# Patient Record
Sex: Male | Born: 1940 | Race: White | Hispanic: No | Marital: Single | Smoking: Former smoker
Health system: Southern US, Community
[De-identification: ages and names within clinical notes are randomized; demographics above are authoritative.]

## PROBLEM LIST (undated history)

## (undated) DIAGNOSIS — F32A Depression, unspecified: Secondary | ICD-10-CM

## (undated) DIAGNOSIS — J4489 Other specified chronic obstructive pulmonary disease: Secondary | ICD-10-CM

## (undated) DIAGNOSIS — F419 Anxiety disorder, unspecified: Secondary | ICD-10-CM

## (undated) DIAGNOSIS — F329 Major depressive disorder, single episode, unspecified: Secondary | ICD-10-CM

## (undated) DIAGNOSIS — R32 Unspecified urinary incontinence: Secondary | ICD-10-CM

## (undated) DIAGNOSIS — J449 Chronic obstructive pulmonary disease, unspecified: Secondary | ICD-10-CM

## (undated) DIAGNOSIS — R339 Retention of urine, unspecified: Secondary | ICD-10-CM

## (undated) DIAGNOSIS — K5792 Diverticulitis of intestine, part unspecified, without perforation or abscess without bleeding: Secondary | ICD-10-CM

## (undated) HISTORY — DX: Major depressive disorder, single episode, unspecified: F32.9

## (undated) HISTORY — DX: Anxiety disorder, unspecified: F41.9

## (undated) HISTORY — DX: Unspecified urinary incontinence: R32

## (undated) HISTORY — PX: OTHER SURGICAL HISTORY: SHX169

## (undated) HISTORY — DX: Diverticulitis of intestine, part unspecified, without perforation or abscess without bleeding: K57.92

## (undated) HISTORY — DX: Depression, unspecified: F32.A

## (undated) HISTORY — DX: Retention of urine, unspecified: R33.9

## (undated) HISTORY — DX: Chronic obstructive pulmonary disease, unspecified: J44.9

## (undated) HISTORY — DX: Other specified chronic obstructive pulmonary disease: J44.89

---

## 2014-10-10 DIAGNOSIS — N419 Inflammatory disease of prostate, unspecified: Secondary | ICD-10-CM | POA: Diagnosis not present

## 2015-04-07 ENCOUNTER — Ambulatory Visit (INDEPENDENT_AMBULATORY_CARE_PROVIDER_SITE_OTHER): Payer: Medicare Other | Admitting: Family Medicine

## 2015-04-07 ENCOUNTER — Encounter: Payer: Self-pay | Admitting: Family Medicine

## 2015-04-07 VITALS — BP 120/77 | HR 65 | Temp 97.8°F | Resp 16 | Ht 73.0 in | Wt 130.2 lb

## 2015-04-07 DIAGNOSIS — R202 Paresthesia of skin: Secondary | ICD-10-CM | POA: Diagnosis not present

## 2015-04-07 DIAGNOSIS — J441 Chronic obstructive pulmonary disease with (acute) exacerbation: Secondary | ICD-10-CM | POA: Diagnosis not present

## 2015-04-07 DIAGNOSIS — N400 Enlarged prostate without lower urinary tract symptoms: Secondary | ICD-10-CM

## 2015-04-07 DIAGNOSIS — G6289 Other specified polyneuropathies: Secondary | ICD-10-CM

## 2015-04-07 DIAGNOSIS — J42 Unspecified chronic bronchitis: Secondary | ICD-10-CM | POA: Insufficient documentation

## 2015-04-07 DIAGNOSIS — E785 Hyperlipidemia, unspecified: Secondary | ICD-10-CM | POA: Diagnosis not present

## 2015-04-07 DIAGNOSIS — G629 Polyneuropathy, unspecified: Secondary | ICD-10-CM | POA: Insufficient documentation

## 2015-04-07 MED ORDER — IPRATROPIUM-ALBUTEROL 18-103 MCG/ACT IN AERO: 2.0000 | INHALATION_SPRAY | Freq: Four times a day (QID) | RESPIRATORY_TRACT | Status: DC | PRN

## 2015-04-07 MED ORDER — PREDNISONE 20 MG PO TABS: 40.0000 mg | ORAL_TABLET | Freq: Every day | ORAL | Status: DC

## 2015-04-07 MED ORDER — AZITHROMYCIN 250 MG PO TABS: ORAL_TABLET | ORAL | Status: DC

## 2015-04-07 MED ORDER — FLUTICASONE PROPIONATE HFA 220 MCG/ACT IN AERO: 2.0000 | INHALATION_SPRAY | Freq: Two times a day (BID) | RESPIRATORY_TRACT | Status: DC

## 2015-04-07 MED ORDER — TAMSULOSIN HCL 0.4 MG PO CAPS: 0.4000 mg | ORAL_CAPSULE | Freq: Every day | ORAL | Status: DC

## 2015-04-07 NOTE — Assessment & Plan Note (Signed)
Treat for COPD exacerbation. Give Antibiotics considering increasing dyspnea, purulent sputum. Prednisone burst. Start steroid inhaler. PRN albuterol inhaler use.  Return 3 mos or if not improved.

## 2015-04-07 NOTE — Progress Notes (Signed)
Subjective:    Patient ID: Connor Lewis, male    DOB: 1941-02-19, 75 y.o.   MRN: [id number]  HPI: Connor Lewis is a 75 y.o. male presenting on 04/07/2015 for Establish Care   HPI  Pt presents to establish care today. Previous care provider was Dr. Vear Clock in [place].  It has been 6 months since His last PCP visit. Records from previous provider will be requested and reviewed. Current medical problems include:  Chronic Bronchitis/ COPD: Diagnosed 6 years ago. Combivent inhaler daily. Rescue inhaler 1-2 times per day. Productive cough. Baseline is white/clear. Cough with some SOB 2-3 weeks ago. Sputum was green. Mild dyspnea at baseline. Increasing dyspnea. Increasing sputum.  BPH- Nocturia and LUTS.  Started on flomax to help with symptoms. Occasional dribbling and hesitancy.  Peripheral neuropathy: Pt is unsure of cause. Taking gabapentin to help with symptoms. Relieves symptoms.   Health maintenance:  Colonoscopy: Unsure. Desires cologuard today.  PSA needed.  Drinks 3-4 drinks nightly.    Past Medical History  Diagnosis Date  . Chronic bronchiolitis (HCC)   . Diverticulitis   . Urine incontinence   . COPD (chronic obstructive pulmonary disease) (HCC)    Social History   Social History  . Marital Status: Single    Spouse Name: N/A  . Number of Children: N/A  . Years of Education: N/A   Occupational History  . Not on file.   Social History Main Topics  . Smoking status: Never Smoker   . Smokeless tobacco: Not on file  . Alcohol Use: Yes  . Drug Use: No  . Sexual Activity: Not on file   Other Topics Concern  . Not on file   Social History Narrative  . No narrative on file   Family History  Problem Relation Age of Onset  . Stroke Mother   . Diabetes Mother   . Heart disease Father    No current outpatient prescriptions on file prior to visit.   No current facility-administered medications on file prior to visit.    Review of Systems  Constitutional:  Negative for fever and chills.  HENT: Negative.   Respiratory: Positive for cough, chest tightness, shortness of breath and wheezing.   Cardiovascular: Negative for chest pain, palpitations and leg swelling.  Gastrointestinal: Negative for nausea, vomiting and abdominal pain.  Endocrine: Negative.   Genitourinary: Positive for frequency and difficulty urinating. Negative for dysuria, urgency, discharge, penile pain and testicular pain.  Musculoskeletal: Negative for back pain, joint swelling and arthralgias.  Skin: Negative.   Neurological: Negative for dizziness, weakness, numbness and headaches.  Psychiatric/Behavioral: Negative for sleep disturbance and dysphoric mood.   Per HPI unless specifically indicated above     Objective:    BP 120/77 mmHg  Pulse 65  Temp(Src) 97.8 F (36.6 C) (Oral)  Resp 16  Ht  (1.854 m)  Wt 130 lb 3.2 oz (59.058 kg)  BMI 17.18 kg/m2  SpO2 97%  Wt Readings from Last 3 Encounters:  04/07/15 130 lb 3.2 oz (59.058 kg)    Physical Exam  Constitutional: He is oriented to person, place, and time. He appears well-developed and well-nourished. No distress.  HENT:  Head: Normocephalic and atraumatic.  Neck: Neck supple. No thyromegaly present.  Cardiovascular: Normal rate, regular rhythm and normal heart sounds.  Exam reveals no gallop and no friction rub.   No murmur heard. Pulmonary/Chest: Effort normal. He has wheezes (with couhging. ) in the right middle field, the right lower field, the left  middle field and the left lower field. He has rhonchi in the right lower field and the left middle field. Chest wall is not dull to percussion. He exhibits no mass and no tenderness.  Abdominal: Soft. Bowel sounds are normal. He exhibits no distension. There is no tenderness. There is no rebound.  Musculoskeletal: Normal range of motion. He exhibits no edema or tenderness.  Neurological: He is alert and oriented to person, place, and time. He has normal  reflexes.  Skin: Skin is warm and dry. No rash noted. No erythema.  Psychiatric: He has a normal mood and affect. His behavior is normal. Thought content normal.   No results found for this or any previous visit.    Assessment & Plan:   Problem List Items Addressed This Visit      Respiratory   Chronic bronchitis (HCC) - Primary    Treat for COPD exacerbation. Give Antibiotics considering increasing dyspnea, purulent sputum. Prednisone burst. Start steroid inhaler. PRN albuterol inhaler use.  Return 3 mos or if not improved.       Relevant Medications   azithromycin (ZITHROMAX) 250 MG tablet   predniSONE (DELTASONE) 20 MG tablet   albuterol-ipratropium (COMBIVENT) 18-103 MCG/ACT inhaler   fluticasone (FLOVENT HFA) 220 MCG/ACT inhaler     Nervous and Auditory   Peripheral neuropathy (HCC)    Continue gabapentin. Get labwork to determine cause since patient unsure. Check B12, consider diabetes screening.       Relevant Medications   gabapentin (NEURONTIN) 100 MG capsule   Other Relevant Orders   CBC with Differential   Vitamin B12     Genitourinary   BPH (benign prostatic hyperplasia)    Check PSA. Continue flomax.       Relevant Medications   tamsulosin (FLOMAX) 0.4 MG CAPS capsule   Other Relevant Orders   PSA    Other Visit Diagnoses    Mild hyperlipidemia        Check lipid panel.     Relevant Orders    Lipid Profile    Paresthesia        Relevant Orders    Vitamin B12       Meds ordered this encounter  Medications  . DISCONTD: albuterol-ipratropium (COMBIVENT) 18-103 MCG/ACT inhaler    Sig: Inhale 1 puff into the lungs every 6 (six) hours as needed for wheezing or shortness of breath.  Marland Kitchen albuterol (VENTOLIN HFA) 108 (90 Base) MCG/ACT inhaler    Sig: Inhale 2 puffs into the lungs as needed for wheezing or shortness of breath.  . gabapentin (NEURONTIN) 100 MG capsule    Sig: Take 100 mg by mouth 3 (three) times daily.  Marland Kitchen DISCONTD: tamsulosin (FLOMAX) 0.4  MG CAPS capsule    Sig: Take 0.4 mg by mouth at bedtime.  Marland Kitchen DISCONTD: predniSONE (DELTASONE) 10 MG tablet    Sig: Take 10 mg by mouth 2 (two) times daily.  Marland Kitchen azithromycin (ZITHROMAX) 250 MG tablet    Sig: Take 2 pills today and 1 pill everyday until bottle empty.    Dispense:  6 tablet    Refill:  0    Order Specific Question:  Supervising Provider    Answer:  Janeann Forehand [[id number]]  . predniSONE (DELTASONE) 20 MG tablet    Sig: Take 2 tablets (40 mg total) by mouth daily with breakfast.    Dispense:  10 tablet    Refill:  0    Order Specific Question:  Supervising Provider  Answer:  Janeann Forehand [[id number]]  . albuterol-ipratropium (COMBIVENT) 18-103 MCG/ACT inhaler    Sig: Inhale 2 puffs into the lungs every 6 (six) hours as needed for wheezing or shortness of breath.    Dispense:  1 Inhaler    Refill:  11    Order Specific Question:  Supervising Provider    Answer:  Janeann Forehand [phone]  . fluticasone (FLOVENT HFA) 220 MCG/ACT inhaler    Sig: Inhale 2 puffs into the lungs 2 (two) times daily.    Dispense:  1 Inhaler    Refill:  12    Order Specific Question:  Supervising Provider    Answer:  Janeann Forehand [phone]  . tamsulosin (FLOMAX) 0.4 MG CAPS capsule    Sig: Take 1 capsule (0.4 mg total) by mouth at bedtime.    Dispense:  30 capsule    Refill:  11    Order Specific Question:  Supervising Provider    Answer:  Janeann Forehand [phone]      Follow up plan: Return in about 3 months (around 07/05/2015), or if symptoms worsen or fail to improve, for COPD.

## 2015-04-07 NOTE — Assessment & Plan Note (Signed)
Continue gabapentin. Get labwork to determine cause since patient unsure. Check B12, consider diabetes screening.

## 2015-04-07 NOTE — Patient Instructions (Signed)
We are treating your for a COPD exacerbation today. Take Prednisone  (2 tablets) every morning for 5 days. Start Zpak antibiotic- 2 pills today and 1 pill daily until bottle empty. Use your combivent inhaler every 4-6 hours as needed.  Start new Floven inhaler 2 puffs twice daily.   Please seek immediate medical attention if you develop shortness of breath not relieve by inhaler, chest pain/tightness, fever > 103 F or other concerning symptoms.

## 2015-04-07 NOTE — Assessment & Plan Note (Signed)
Check PSA. Continue flomax.

## 2015-04-11 DIAGNOSIS — R202 Paresthesia of skin: Secondary | ICD-10-CM | POA: Diagnosis not present

## 2015-04-11 DIAGNOSIS — J441 Chronic obstructive pulmonary disease with (acute) exacerbation: Secondary | ICD-10-CM | POA: Diagnosis not present

## 2015-04-11 DIAGNOSIS — E785 Hyperlipidemia, unspecified: Secondary | ICD-10-CM | POA: Diagnosis not present

## 2015-04-11 DIAGNOSIS — G6289 Other specified polyneuropathies: Secondary | ICD-10-CM | POA: Diagnosis not present

## 2015-04-11 DIAGNOSIS — R7303 Prediabetes: Secondary | ICD-10-CM | POA: Diagnosis not present

## 2015-04-11 DIAGNOSIS — N400 Enlarged prostate without lower urinary tract symptoms: Secondary | ICD-10-CM | POA: Diagnosis not present

## 2015-04-11 DIAGNOSIS — R740 Nonspecific elevation of levels of transaminase and lactic acid dehydrogenase [LDH]: Secondary | ICD-10-CM | POA: Diagnosis not present

## 2015-04-12 ENCOUNTER — Other Ambulatory Visit: Payer: Self-pay | Admitting: Family Medicine

## 2015-04-12 DIAGNOSIS — J410 Simple chronic bronchitis: Secondary | ICD-10-CM

## 2015-04-12 LAB — COMPREHENSIVE METABOLIC PANEL
A/G RATIO: 2 (ref 1.1–2.5)
ALK PHOS: 66 IU/L (ref 39–117)
ALT: 13 IU/L (ref 0–44)
AST: 18 IU/L (ref 0–40)
Albumin: 4.1 g/dL (ref 3.5–4.8)
BUN/Creatinine Ratio: 16 (ref 10–22)
BUN: 14 mg/dL (ref 8–27)
Bilirubin Total: 0.5 mg/dL (ref 0.0–1.2)
CALCIUM: 9.7 mg/dL (ref 8.6–10.2)
CHLORIDE: 96 mmol/L (ref 96–106)
CO2: 25 mmol/L (ref 18–29)
Creatinine, Ser: 0.86 mg/dL (ref 0.76–1.27)
GFR calc Af Amer: 100 mL/min/{1.73_m2} (ref >59)
GFR, EST NON AFRICAN AMERICAN: 87 mL/min/{1.73_m2} (ref >59)
GLOBULIN, TOTAL: 2.1 g/dL (ref 1.5–4.5)
Glucose: 152 mg/dL — ABNORMAL HIGH (ref 65–99)
POTASSIUM: 3.9 mmol/L (ref 3.5–5.2)
SODIUM: 139 mmol/L (ref 134–144)
Total Protein: 6.2 g/dL (ref 6.0–8.5)

## 2015-04-12 LAB — CBC WITH DIFFERENTIAL/PLATELET
BASOS ABS: 0 10*3/uL (ref 0.0–0.2)
BASOS: 0 %
EOS (ABSOLUTE): 0.1 10*3/uL (ref 0.0–0.4)
EOS: 1 %
HEMATOCRIT: 42.6 % (ref 37.5–51.0)
HEMOGLOBIN: 14.6 g/dL (ref 12.6–17.7)
IMMATURE GRANS (ABS): 0 10*3/uL (ref 0.0–0.1)
Immature Granulocytes: 0 %
LYMPHS ABS: 0.8 10*3/uL (ref 0.7–3.1)
LYMPHS: 5 %
MCH: 29.8 pg (ref 26.6–33.0)
MCHC: 34.3 g/dL (ref 31.5–35.7)
MCV: 87 fL (ref 79–97)
MONOCYTES: 1 %
Monocytes Absolute: 0.2 10*3/uL (ref 0.1–0.9)
NEUTROS ABS: 15.1 10*3/uL — AB (ref 1.4–7.0)
Neutrophils: 93 %
Platelets: 261 10*3/uL (ref 150–379)
RBC: 4.9 x10E6/uL (ref 4.14–5.80)
RDW: 13.7 % (ref 12.3–15.4)
WBC: 16.2 10*3/uL — ABNORMAL HIGH (ref 3.4–10.8)

## 2015-04-12 LAB — PSA: PROSTATE SPECIFIC AG, SERUM: 3.5 ng/mL (ref 0.0–4.0)

## 2015-04-12 LAB — LIPID PANEL
CHOL/HDL RATIO: 3.5 ratio (ref 0.0–5.0)
CHOLESTEROL TOTAL: 242 mg/dL — AB (ref 100–199)
HDL: 70 mg/dL (ref >39)
LDL Calculated: 138 mg/dL — ABNORMAL HIGH (ref 0–99)
TRIGLYCERIDES: 171 mg/dL — AB (ref 0–149)
VLDL Cholesterol Cal: 34 mg/dL (ref 5–40)

## 2015-04-12 LAB — VITAMIN B12: VITAMIN B 12: 615 pg/mL (ref 211–946)

## 2015-04-12 MED ORDER — IPRATROPIUM-ALBUTEROL 20-100 MCG/ACT IN AERS: 1.0000 | INHALATION_SPRAY | Freq: Four times a day (QID) | RESPIRATORY_TRACT | Status: DC

## 2015-04-13 ENCOUNTER — Telehealth: Payer: Self-pay | Admitting: Family Medicine

## 2015-04-13 DIAGNOSIS — R7301 Impaired fasting glucose: Secondary | ICD-10-CM

## 2015-04-13 DIAGNOSIS — E785 Hyperlipidemia, unspecified: Secondary | ICD-10-CM

## 2015-04-13 MED ORDER — ATORVASTATIN CALCIUM 40 MG PO TABS: 40.0000 mg | ORAL_TABLET | Freq: Every day | ORAL | Status: DC

## 2015-04-13 NOTE — Telephone Encounter (Signed)
Called [facility] to add on HgA1c.  

## 2015-04-13 NOTE — Telephone Encounter (Signed)
Called pt to review lab results. Will follow-up on white count in 1 mos.

## 2015-04-18 ENCOUNTER — Telehealth: Payer: Self-pay | Admitting: Family Medicine

## 2015-04-18 NOTE — Telephone Encounter (Signed)
Was mailed to the pt.

## 2015-04-18 NOTE — Telephone Encounter (Signed)
Pt needs his lab results mailed.  His call back number is [phone], address [address] [place] [phone]

## 2015-05-03 LAB — HGB A1C W/O EAG: Hgb A1c MFr Bld: 6.2 % — ABNORMAL HIGH (ref 4.8–5.6)

## 2015-05-03 LAB — SPECIMEN STATUS REPORT

## 2015-05-11 ENCOUNTER — Ambulatory Visit (INDEPENDENT_AMBULATORY_CARE_PROVIDER_SITE_OTHER): Payer: Medicare Other | Admitting: Family Medicine

## 2015-05-11 ENCOUNTER — Encounter: Payer: Self-pay | Admitting: Family Medicine

## 2015-05-11 VITALS — BP 123/82 | HR 74 | Temp 98.2°F | Resp 16 | Ht 73.0 in | Wt 128.8 lb

## 2015-05-11 DIAGNOSIS — R7303 Prediabetes: Secondary | ICD-10-CM | POA: Diagnosis not present

## 2015-05-11 DIAGNOSIS — H6123 Impacted cerumen, bilateral: Secondary | ICD-10-CM | POA: Diagnosis not present

## 2015-05-11 DIAGNOSIS — J301 Allergic rhinitis due to pollen: Secondary | ICD-10-CM

## 2015-05-11 DIAGNOSIS — J309 Allergic rhinitis, unspecified: Secondary | ICD-10-CM | POA: Insufficient documentation

## 2015-05-11 DIAGNOSIS — D72829 Elevated white blood cell count, unspecified: Secondary | ICD-10-CM | POA: Diagnosis not present

## 2015-05-11 DIAGNOSIS — J410 Simple chronic bronchitis: Secondary | ICD-10-CM

## 2015-05-11 MED ORDER — CARBAMIDE PEROXIDE 6.5 % OT SOLN: 5.0000 [drp] | Freq: Two times a day (BID) | OTIC | Status: AC

## 2015-05-11 MED ORDER — FLUTICASONE PROPIONATE 50 MCG/ACT NA SUSP: 2.0000 | Freq: Every day | NASAL | Status: DC

## 2015-05-11 MED ORDER — LORATADINE 10 MG PO CAPS: 1.0000 | ORAL_CAPSULE | Freq: Every day | ORAL | Status: DC

## 2015-05-11 NOTE — Patient Instructions (Signed)
Allergic Rhinitis Allergic rhinitis is when the mucous membranes in the nose respond to allergens. Allergens are particles in the air that cause your body to have an allergic reaction. This causes you to release allergic antibodies. Through a chain of events, these eventually cause you to release histamine into the blood stream. Although meant to protect the body, it is this release of histamine that causes your discomfort, such as frequent sneezing, congestion, and an itchy, runny nose.  CAUSES Seasonal allergic rhinitis (hay fever) is caused by pollen allergens that may come from grasses, trees, and weeds. Year-round allergic rhinitis (perennial allergic rhinitis) is caused by allergens such as house dust mites, pet dander, and mold spores. SYMPTOMS  Nasal stuffiness (congestion).  Itchy, runny nose with sneezing and tearing of the eyes. DIAGNOSIS Your health care provider can help you determine the allergen or allergens that trigger your symptoms. If you and your health care provider are unable to determine the allergen, skin or blood testing may be used. Your health care provider will diagnose your condition after taking your health history and performing a physical exam. Your health care provider may assess you for other related conditions, such as asthma, pink eye, or an ear infection. TREATMENT Allergic rhinitis does not have a cure, but it can be controlled by:  Medicines that block allergy symptoms. These may include allergy shots, nasal sprays, and oral antihistamines.  Avoiding the allergen. Hay fever may often be treated with antihistamines in pill or nasal spray forms. Antihistamines block the effects of histamine. There are over-the-counter medicines that may help with nasal congestion and swelling around the eyes. Check with your health care provider before taking or giving this medicine. If avoiding the allergen or the medicine prescribed do not work, there are many new medicines  your health care provider can prescribe. Stronger medicine may be used if initial measures are ineffective. Desensitizing injections can be used if medicine and avoidance does not work. Desensitization is when a patient is given ongoing shots until the body becomes less sensitive to the allergen. Make sure you follow up with your health care provider if problems continue. HOME CARE INSTRUCTIONS It is not possible to completely avoid allergens, but you can reduce your symptoms by taking steps to limit your exposure to them. It helps to know exactly what you are allergic to so that you can avoid your specific triggers. SEEK MEDICAL CARE IF:  You have a fever.  You develop a cough that does not stop easily (persistent).  You have shortness of breath.  You start wheezing.  Symptoms interfere with normal daily activities.   This information is not intended to replace advice given to you by your health care provider. Make sure you discuss any questions you have with your health care provider.   Document Released: 11/13/2000 Document Revised: 03/11/2014 Document Reviewed: 10/26/2012 Elsevier Interactive Patient Education 2016 [employer]  

## 2015-05-11 NOTE — Assessment & Plan Note (Addendum)
Start inhaled nasal steroid today to reduce symptoms. Start OTC antihistamine to help with symptoms. Risks, benefits, and side effects of each medication reviewed.

## 2015-05-11 NOTE — Assessment & Plan Note (Signed)
Last A1c 6.2%- reviewed diet and lifestyle changes. Will recheck in 3 mos.

## 2015-05-11 NOTE — Assessment & Plan Note (Addendum)
Patient symptoms are back at baseline. Doing well. Alarm symptoms reviewed. Recheck in 3 mos.

## 2015-05-11 NOTE — Progress Notes (Signed)
Subjective:    Patient ID: Connor Lewis, male    DOB: 1940-10-07, 75 y.o.   MRN: [id number]  HPI: Connor Lewis is a 75 y.o. male presenting on 05/11/2015 for Follow-up   HPI   COPD: Sputum is back to clear/white normal color and thickness. Still coughing up a good amount of sputum, but back to his baseline. No SOB, CP. Uses inhaler with activity. Does not have to use it otherwise.   Allergy symptoms: Watery eyes x1 week and clear, thin rhinorrhea. No purulent discharge from eyes. No ear pain. Sneezing. Possible PND. Fullness feeling around eyes. Feels congested. No recent HA's. Has not used any medication for symptoms.   Hyperlipidemia: Started lipitor. No myalgias. Doing well taking medication.   Flomax - helps. Gets up at least 2 times per night.  Elevated WBC's last visit- will recheck today.     Past Medical History  Diagnosis Date  . Chronic bronchiolitis (HCC)   . Diverticulitis   . Urine incontinence   . COPD (chronic obstructive pulmonary disease) (HCC)     Current Outpatient Prescriptions on File Prior to Visit  Medication Sig  . albuterol (VENTOLIN HFA) 108 (90 Base) MCG/ACT inhaler Inhale 2 puffs into the lungs as needed for wheezing or shortness of breath.  Marland Kitchen atorvastatin (LIPITOR) 40 MG tablet Take 1 tablet (40 mg total) by mouth daily.  . fluticasone (FLOVENT HFA) 220 MCG/ACT inhaler Inhale 2 puffs into the lungs 2 (two) times daily.  Marland Kitchen gabapentin (NEURONTIN) 100 MG capsule Take 100 mg by mouth 3 (three) times daily.  . Ipratropium-Albuterol (COMBIVENT) 20-100 MCG/ACT AERS respimat Inhale 1 puff into the lungs every 6 (six) hours.  . tamsulosin (FLOMAX) 0.4 MG CAPS capsule Take 1 capsule (0.4 mg total) by mouth at bedtime.   No current facility-administered medications on file prior to visit.    Review of Systems  Constitutional: Negative for fever, chills, diaphoresis, activity change, appetite change and fatigue.  HENT: Positive for congestion and postnasal  drip. Negative for ear discharge, ear pain, sinus pressure and sore throat.   Eyes: Positive for discharge and itching. Negative for pain and visual disturbance.  Respiratory: Positive for cough. Negative for chest tightness and shortness of breath.   Cardiovascular: Negative for chest pain.  Genitourinary: Positive for frequency (at night). Negative for dysuria, decreased urine volume and difficulty urinating.  Musculoskeletal: Negative for myalgias and arthralgias.  Skin: Negative for color change.  Allergic/Immunologic: Positive for environmental allergies.  Neurological: Negative for dizziness, weakness, light-headedness, numbness and headaches.  Hematological: Negative for adenopathy.  Psychiatric/Behavioral: Negative for behavioral problems, sleep disturbance and agitation.   Per HPI unless specifically indicated above     Objective:    BP 123/82 mmHg  Pulse 74  Temp(Src) 98.2 F (36.8 C) (Oral)  Resp 16  Ht  (1.854 m)  Wt 128 lb 12.8 oz (58.423 kg)  BMI 17.00 kg/m2  SpO2 97%  Wt Readings from Last 3 Encounters:  05/11/15 128 lb 12.8 oz (58.423 kg)  04/07/15 130 lb 3.2 oz (59.058 kg)    Physical Exam  Constitutional: He is oriented to person, place, and time. He appears well-developed.  HENT:  Head: Normocephalic.  Nose: Mucosal edema and rhinorrhea present. Right sinus exhibits no maxillary sinus tenderness and no frontal sinus tenderness. Left sinus exhibits no maxillary sinus tenderness and no frontal sinus tenderness.  Mouth/Throat: Oropharynx is clear and moist and mucous membranes are normal. No oropharyngeal exudate, posterior oropharyngeal edema or posterior  oropharyngeal erythema.  Unable to visualize TM's. Bilateral cerumen present.  Eyes: Conjunctivae are normal. Right eye exhibits no discharge. Left eye exhibits no discharge.  Neck: Normal range of motion. Carotid bruit is not present.  Cardiovascular: Normal rate, regular rhythm and normal heart sounds.    Pulses:      Radial pulses are 2+ on the right side, and 2+ on the left side.  Pulmonary/Chest: Effort normal and breath sounds normal. No respiratory distress. He has no wheezes. He has no rales.  Abdominal: Normal appearance.  Musculoskeletal: Normal range of motion.  Neurological: He is alert and oriented to person, place, and time.  Skin: Skin is warm and dry.  Psychiatric: He has a normal mood and affect. His behavior is normal.   Results for orders placed or performed in visit on 04/07/15  Comprehensive Metabolic Panel (CMET)  Result Value Ref Range   Glucose 152 (H) 65 - 99 mg/dL   BUN 14 8 - 27 mg/dL   Creatinine, Ser 1.610.86 0.76 - 1.27 mg/dL   GFR calc non Af Amer 87 >59 mL/min/1.73   GFR calc Af Amer 100 >59 mL/min/1.73   BUN/Creatinine Ratio 16 10 - 22   Sodium 139 134 - 144 mmol/L   Potassium 3.9 3.5 - 5.2 mmol/L   Chloride 96 96 - 106 mmol/L   CO2 25 18 - 29 mmol/L   Calcium 9.7 8.6 - 10.2 mg/dL   Total Protein 6.2 6.0 - 8.5 g/dL   Albumin 4.1 3.5 - 4.8 g/dL   Globulin, Total 2.1 1.5 - 4.5 g/dL   Albumin/Globulin Ratio 2.0 1.1 - 2.5   Bilirubin Total 0.5 0.0 - 1.2 mg/dL   Alkaline Phosphatase 66 39 - 117 IU/L   AST 18 0 - 40 IU/L   ALT 13 0 - 44 IU/L  Lipid Profile  Result Value Ref Range   Cholesterol, Total 242 (H) 100 - 199 mg/dL   Triglycerides 096171 (H) 0 - 149 mg/dL   HDL 70 >04>39 mg/dL   VLDL Cholesterol Cal 34 5 - 40 mg/dL   LDL Calculated 540138 (H) 0 - 99 mg/dL   Chol/HDL Ratio 3.5 0.0 - 5.0 ratio units  CBC with Differential  Result Value Ref Range   WBC 16.2 (H) 3.4 - 10.8 x10E3/uL   RBC 4.90 4.14 - 5.80 x10E6/uL   Hemoglobin 14.6 12.6 - 17.7 g/dL   Hematocrit 98.142.6 19.137.5 - 51.0 %   MCV 87 79 - 97 fL   MCH 29.8 26.6 - 33.0 pg   MCHC 34.3 31.5 - 35.7 g/dL   RDW 47.813.7 29.512.3 - 62.115.4 %   Platelets 261 150 - 379 x10E3/uL   Neutrophils 93 %   Lymphs 5 %   Monocytes 1 %   Eos 1 %   Basos 0 %   Neutrophils Absolute 15.1 (H) 1.4 - 7.0 x10E3/uL   Lymphocytes  Absolute 0.8 0.7 - 3.1 x10E3/uL   Monocytes Absolute 0.2 0.1 - 0.9 x10E3/uL   EOS (ABSOLUTE) 0.1 0.0 - 0.4 x10E3/uL   Basophils Absolute 0.0 0.0 - 0.2 x10E3/uL   Immature Granulocytes 0 %   Immature Grans (Abs) 0.0 0.0 - 0.1 x10E3/uL  Vitamin B12  Result Value Ref Range   Vitamin B-12 615 211 - 946 pg/mL  PSA  Result Value Ref Range   Prostate Specific Ag, Serum 3.5 0.0 - 4.0 ng/mL  Hgb A1c w/o eAG  Result Value Ref Range   Hgb A1c MFr Bld 6.2 (H) 4.8 -  5.6 %  Specimen status report  Result Value Ref Range   specimen status report Comment       Assessment & Plan:   Problem List Items Addressed This Visit      Respiratory   Chronic bronchitis (HCC)    Patient symptoms are back at baseline. Doing well. Alarm symptoms reviewed. Recheck in 3 mos.       Allergic rhinitis - Primary    Start inhaled nasal steroid today to reduce symptoms. Start OTC antihistamine to help with symptoms. Risks, benefits, and side effects of each medication reviewed.      Relevant Medications   fluticasone (FLONASE) 50 MCG/ACT nasal spray   Loratadine 10 MG CAPS     Other   Prediabetes    Last A1c 6.2%- reviewed diet and lifestyle changes. Will recheck in 3 mos.        Other Visit Diagnoses    Elevated white blood cell count        Recheck white count.     Relevant Orders    CBC with Differential    Cerumen impaction, bilateral        Recommend ear wax removal drops over the counter. Avoid Qtips.     Relevant Medications    carbamide peroxide (EAR WAX REMOVAL DROPS) 6.5 % otic solution       Meds ordered this encounter  Medications  . fluticasone (FLONASE) 50 MCG/ACT nasal spray    Sig: Place 2 sprays into both nostrils daily.    Dispense:  16 g    Refill:  11    Order Specific Question:  Supervising Provider    Answer:  Janeann Forehand [phone]  . Loratadine 10 MG CAPS    Sig: Take 1 capsule (10 mg total) by mouth daily.    Dispense:  30 each    Order Specific Question:   Supervising Provider    Answer:  Janeann Forehand [phone]  . carbamide peroxide (EAR WAX REMOVAL DROPS) 6.5 % otic solution    Sig: Place 5 drops into both ears 2 (two) times daily.    Dispense:  15 mL    Refill:  0    Order Specific Question:  Supervising Provider    Answer:  Janeann Forehand [[id number]]      Follow up plan: Return in about 2 months (around 07/11/2015) for Prediabetes.

## 2015-05-29 DIAGNOSIS — D72829 Elevated white blood cell count, unspecified: Secondary | ICD-10-CM | POA: Diagnosis not present

## 2015-05-30 LAB — CBC WITH DIFFERENTIAL/PLATELET
BASOS ABS: 0 10*3/uL (ref 0.0–0.2)
Basos: 0 %
EOS (ABSOLUTE): 0.1 10*3/uL (ref 0.0–0.4)
EOS: 1 %
HEMATOCRIT: 43.4 % (ref 37.5–51.0)
Hemoglobin: 14.6 g/dL (ref 12.6–17.7)
IMMATURE GRANULOCYTES: 0 %
Immature Grans (Abs): 0 10*3/uL (ref 0.0–0.1)
Lymphocytes Absolute: 0.5 10*3/uL — ABNORMAL LOW (ref 0.7–3.1)
Lymphs: 4 %
MCH: 29 pg (ref 26.6–33.0)
MCHC: 33.6 g/dL (ref 31.5–35.7)
MCV: 86 fL (ref 79–97)
MONOS ABS: 0.4 10*3/uL (ref 0.1–0.9)
Monocytes: 3 %
NEUTROS PCT: 92 %
Neutrophils Absolute: 11.3 10*3/uL — ABNORMAL HIGH (ref 1.4–7.0)
PLATELETS: 249 10*3/uL (ref 150–379)
RBC: 5.04 x10E6/uL (ref 4.14–5.80)
RDW: 14.2 % (ref 12.3–15.4)
WBC: 12.5 10*3/uL — AB (ref 3.4–10.8)

## 2015-06-05 ENCOUNTER — Telehealth: Payer: Self-pay | Admitting: Family Medicine

## 2015-06-05 NOTE — Telephone Encounter (Signed)
Onset last week yesterday got worst it's swelling up and yesterday has went to bathroom 10 times from mid night pt was Rx Cipro and pt advised to Schedule appointment per Amy.

## 2015-06-05 NOTE — Telephone Encounter (Signed)
Pt is have prostate problems and trouble urinating.  Please call [phone]

## 2015-06-07 ENCOUNTER — Ambulatory Visit (INDEPENDENT_AMBULATORY_CARE_PROVIDER_SITE_OTHER): Payer: Medicare Other | Admitting: Family Medicine

## 2015-06-07 VITALS — BP 122/81 | HR 70 | Temp 97.8°F | Resp 16 | Ht 73.0 in | Wt 130.0 lb

## 2015-06-07 DIAGNOSIS — J410 Simple chronic bronchitis: Secondary | ICD-10-CM

## 2015-06-07 DIAGNOSIS — R3915 Urgency of urination: Secondary | ICD-10-CM

## 2015-06-07 DIAGNOSIS — N410 Acute prostatitis: Secondary | ICD-10-CM

## 2015-06-07 LAB — POCT URINALYSIS DIPSTICK
Bilirubin, UA: NEGATIVE
Glucose, UA: NEGATIVE
Ketones, UA: NEGATIVE
Leukocytes, UA: NEGATIVE
Nitrite, UA: NEGATIVE
Spec Grav, UA: 1.015
Urobilinogen, UA: NEGATIVE
pH, UA: 5

## 2015-06-07 MED ORDER — CIPROFLOXACIN HCL 500 MG PO TABS: 500.0000 mg | ORAL_TABLET | Freq: Two times a day (BID) | ORAL | Status: AC

## 2015-06-07 MED ORDER — ALBUTEROL SULFATE (2.5 MG/3ML) 0.083% IN NEBU: 2.5000 mg | INHALATION_SOLUTION | Freq: Four times a day (QID) | RESPIRATORY_TRACT | Status: DC | PRN

## 2015-06-07 NOTE — Assessment & Plan Note (Signed)
Increase SOB but no other concerning symptoms. Do not think it is an exacerbation at this time. Will try a nebulizer machine to help with breathing. Pt has COPD follow-up in 1 month. Will call if breathing does not improve.

## 2015-06-07 NOTE — Patient Instructions (Signed)

## 2015-06-07 NOTE — Progress Notes (Signed)
Subjective:    Patient ID: Connor Lewis, male    DOB: 03-12-1940, 75 y.o.   MRN: [id number]  HPI: Connor Lewis is a 75 y.o. male presenting on 06/07/2015 for Benign Prostatic Hypertrophy   HPI  Pt presents for urinary symptoms. Urinary frequency began on Sunday. Symptoms resolved yesterday. Some burning when he urinates. Does not feel he is completely emptying the bladder. Mild pain in pelvis.  Some leakage of urine. No blood in urine. Has been treated for prostatitis in the past.   Pt also reporting some increase in SOB. Has SOB at baseline with COPD. Felt short of breath when he was up and down to the bathroom. No increased sputum production. Using flovent inhaler BID and albuterol PRN.    Past Medical History  Diagnosis Date  . Chronic bronchiolitis (HCC)   . Diverticulitis   . Urine incontinence   . COPD (chronic obstructive pulmonary disease) (HCC)     Current Outpatient Prescriptions on File Prior to Visit  Medication Sig  . atorvastatin (LIPITOR) 40 MG tablet Take 1 tablet (40 mg total) by mouth daily.  . carbamide peroxide (EAR WAX REMOVAL DROPS) 6.5 % otic solution Place 5 drops into both ears 2 (two) times daily.  . fluticasone (FLONASE) 50 MCG/ACT nasal spray Place 2 sprays into both nostrils daily.  . fluticasone (FLOVENT HFA) 220 MCG/ACT inhaler Inhale 2 puffs into the lungs 2 (two) times daily.  Marland Kitchen gabapentin (NEURONTIN) 100 MG capsule Take 100 mg by mouth 3 (three) times daily.  . Ipratropium-Albuterol (COMBIVENT) 20-100 MCG/ACT AERS respimat Inhale 1 puff into the lungs every 6 (six) hours.  . Loratadine 10 MG CAPS Take 1 capsule (10 mg total) by mouth daily.  . tamsulosin (FLOMAX) 0.4 MG CAPS capsule Take 1 capsule (0.4 mg total) by mouth at bedtime.   No current facility-administered medications on file prior to visit.    Review of Systems  Constitutional: Negative for fever and chills.  HENT: Negative for congestion, sinus pressure and sneezing.   Respiratory:  Positive for shortness of breath. Negative for cough, chest tightness and wheezing.   Cardiovascular: Negative for chest pain, palpitations and leg swelling.  Gastrointestinal: Positive for rectal pain.  Genitourinary: Positive for urgency, frequency and difficulty urinating. Negative for flank pain, decreased urine volume, discharge, penile swelling, scrotal swelling and penile pain.   Per HPI unless specifically indicated above     Objective:    BP 122/81 mmHg  Pulse 70  Temp(Src) 97.8 F (36.6 C) (Oral)  Resp 16  Ht  (1.854 m)  Wt 130 lb (58.968 kg)  BMI 17.16 kg/m2  Wt Readings from Last 3 Encounters:  06/07/15 130 lb (58.968 kg)  05/11/15 128 lb 12.8 oz (58.423 kg)  04/07/15 130 lb 3.2 oz (59.058 kg)    Physical Exam  Constitutional: He is oriented to person, place, and time. He appears well-developed and well-nourished. No distress.  HENT:  Head: Normocephalic and atraumatic.  Neck: Normal range of motion. Neck supple.  Cardiovascular: Normal rate and regular rhythm.  Exam reveals no gallop and no friction rub.   No murmur heard. Pulmonary/Chest: Effort normal. No respiratory distress. He has wheezes in the right middle field and the left middle field. He has no rhonchi. He has no rales. Chest wall is not dull to percussion. He exhibits no tenderness.  Abdominal: Soft. Normal appearance and bowel sounds are normal. He exhibits no shifting dullness and no abdominal bruit. There is tenderness in the  suprapubic area. There is no rigidity, no guarding, no CVA tenderness, no tenderness at McBurney's point and negative Murphy's sign.  Genitourinary: Prostate is enlarged and tender.  Neurological: He is alert and oriented to person, place, and time. No cranial nerve deficit. Coordination normal.  Skin: He is not diaphoretic.  Psychiatric: His behavior is normal.   Results for orders placed or performed in visit on 06/07/15  POCT urinalysis dipstick  Result Value Ref Range     Color, UA yellow    Clarity, UA clear    Glucose, UA negative    Bilirubin, UA negative    Ketones, UA negative    Spec Grav, UA 1.015    Blood, UA trace    pH, UA 5.0    Protein, UA trace    Urobilinogen, UA negative    Nitrite, UA negative    Leukocytes, UA Negative Negative      Assessment & Plan:   Problem List Items Addressed This Visit      Respiratory   Chronic bronchitis (HCC)    Increase SOB but no other concerning symptoms. Do not think it is an exacerbation at this time. Will try a nebulizer machine to help with breathing. Pt has COPD follow-up in 1 month. Will call if breathing does not improve.       Relevant Medications   albuterol (PROVENTIL) (2.5 MG/3ML) 0.083% nebulizer solution   Other Relevant Orders   DME Nebulizer machine    Other Visit Diagnoses    Urinary urgency    -  Primary    Likely 2/2 BPH or prostate infection. Will culture urine. needs repeat UA next visit for hematuria.     Relevant Orders    POCT urinalysis dipstick (Completed)    CULTURE, URINE COMPREHENSIVE    Acute prostatitis        Treat for prostatitis given tender enlarged prostate. Cipro BID 10 days. Follow-up with urology if symptoms not improving. Alarm symptoms reviewed.     Relevant Medications    ciprofloxacin (CIPRO) 500 MG tablet    Other Relevant Orders    CULTURE, URINE COMPREHENSIVE       Meds ordered this encounter  Medications  . ciprofloxacin (CIPRO) 500 MG tablet    Sig: Take 1 tablet (500 mg total) by mouth 2 (two) times daily.    Dispense:  20 tablet    Refill:  0    Order Specific Question:  Supervising Provider    Answer:  Janeann ForehandHAWKINS JR, JAMES H [phone][[id number]]  . albuterol (PROVENTIL) (2.5 MG/3ML) 0.083% nebulizer solution    Sig: Take 3 mLs (2.5 mg total) by nebulization every 6 (six) hours as needed for wheezing or shortness of breath.    Dispense:  150 mL    Refill:  1    Order Specific Question:  Supervising Provider    Answer:  Janeann ForehandHAWKINS JR, JAMES H [[id number]]       Follow up plan: Return in about 4 weeks (around 07/05/2015), or if symptoms worsen or fail to improve, for shortness of breath. .Marland Kitchen

## 2015-06-07 NOTE — Telephone Encounter (Signed)
PA submitted [employer].Mellette

## 2015-06-08 ENCOUNTER — Telehealth: Payer: Self-pay | Admitting: *Deleted

## 2015-06-08 MED ORDER — ALBUTEROL SULFATE HFA 108 (90 BASE) MCG/ACT IN AERS: 2.0000 | INHALATION_SPRAY | Freq: Four times a day (QID) | RESPIRATORY_TRACT | Status: DC | PRN

## 2015-06-08 NOTE — Telephone Encounter (Signed)
Patient notified.[place] 

## 2015-06-08 NOTE — Telephone Encounter (Signed)
Patient says he needs Ventolin break-thru breathing. Albuterol Sulfate is pending approval from ins. Company.

## 2015-06-08 NOTE — Telephone Encounter (Signed)
Refills sent to pharmacy. Please let him know. Thanks! AK

## 2015-06-10 LAB — CULTURE, URINE COMPREHENSIVE

## 2015-06-13 ENCOUNTER — Other Ambulatory Visit: Payer: Self-pay | Admitting: *Deleted

## 2015-06-13 DIAGNOSIS — J410 Simple chronic bronchitis: Secondary | ICD-10-CM

## 2015-06-13 MED ORDER — ALBUTEROL SULFATE (2.5 MG/3ML) 0.083% IN NEBU: 2.5000 mg | INHALATION_SOLUTION | Freq: Four times a day (QID) | RESPIRATORY_TRACT | Status: DC | PRN

## 2015-06-14 ENCOUNTER — Telehealth: Payer: Self-pay | Admitting: Family Medicine

## 2015-06-14 NOTE — Telephone Encounter (Signed)
Patient received Albuterol from [facility]. Patient also received pulse ox from [facility] I guess and it was supposed to be picked up on yesterday and wasn't.

## 2015-06-15 NOTE — Telephone Encounter (Signed)
Patient advised to call office if [facility] does not pick up pulse ox. Nothing further needed.

## 2015-06-15 NOTE — Telephone Encounter (Signed)
Okay. Do we need to get in touch with him and does [facility] need orders?

## 2015-06-19 ENCOUNTER — Other Ambulatory Visit: Payer: Self-pay | Admitting: Family Medicine

## 2015-06-19 DIAGNOSIS — J410 Simple chronic bronchitis: Secondary | ICD-10-CM

## 2015-06-20 ENCOUNTER — Other Ambulatory Visit: Payer: Self-pay | Admitting: Family Medicine

## 2015-06-20 ENCOUNTER — Other Ambulatory Visit: Payer: Self-pay | Admitting: *Deleted

## 2015-06-20 ENCOUNTER — Telehealth: Payer: Self-pay | Admitting: Family Medicine

## 2015-06-20 DIAGNOSIS — J42 Unspecified chronic bronchitis: Secondary | ICD-10-CM

## 2015-06-20 NOTE — Telephone Encounter (Signed)
Pt. Called requesting a refill on ciprofloxacin  500 mg states that he was getting better at the end after he stop med's seems like problem flared up again  Pt call back # is [phone]

## 2015-06-20 NOTE — Telephone Encounter (Signed)
If symptoms are continuing- we probably need to do a follow-up urine culture and exam. Thanks! AK

## 2015-06-21 ENCOUNTER — Telehealth: Payer: Self-pay | Admitting: *Deleted

## 2015-06-21 DIAGNOSIS — J410 Simple chronic bronchitis: Secondary | ICD-10-CM

## 2015-06-21 NOTE — Telephone Encounter (Signed)
Done

## 2015-06-21 NOTE — Telephone Encounter (Signed)
Mandy called and ono on room air was done. Please enter order for ono on 2 Liter at night.

## 2015-06-21 NOTE — Telephone Encounter (Signed)
Patient aware of reply.Connor Lewis

## 2015-06-22 ENCOUNTER — Ambulatory Visit (INDEPENDENT_AMBULATORY_CARE_PROVIDER_SITE_OTHER): Payer: Medicare Other | Admitting: Family Medicine

## 2015-06-22 ENCOUNTER — Encounter: Payer: Self-pay | Admitting: Family Medicine

## 2015-06-22 ENCOUNTER — Telehealth: Payer: Self-pay | Admitting: Family Medicine

## 2015-06-22 VITALS — BP 123/75 | HR 81 | Temp 98.3°F | Resp 16 | Ht 73.0 in | Wt 128.0 lb

## 2015-06-22 DIAGNOSIS — N390 Urinary tract infection, site not specified: Secondary | ICD-10-CM | POA: Diagnosis not present

## 2015-06-22 DIAGNOSIS — N400 Enlarged prostate without lower urinary tract symptoms: Secondary | ICD-10-CM | POA: Diagnosis not present

## 2015-06-22 LAB — POCT URINALYSIS DIPSTICK
Bilirubin, UA: NEGATIVE
GLUCOSE UA: NEGATIVE
Ketones, UA: NEGATIVE
Leukocytes, UA: NEGATIVE
NITRITE UA: NEGATIVE
PH UA: 7
Protein, UA: NEGATIVE
RBC UA: NEGATIVE
Spec Grav, UA: 1.015
UROBILINOGEN UA: NEGATIVE

## 2015-06-22 MED ORDER — SULFAMETHOXAZOLE-TRIMETHOPRIM 800-160 MG PO TABS: 1.0000 | ORAL_TABLET | Freq: Two times a day (BID) | ORAL | Status: DC

## 2015-06-22 NOTE — Patient Instructions (Signed)
You are being treated for a urinary tract infection today. Please take your antibiotic as directed. If you develop severe flank pain, blood in the urine, fever, nausea or vomiting, please seek immediate medical attention in the ER.    If your symptoms do not go away, please let me know and we will have you seen by a urologist.

## 2015-06-22 NOTE — Telephone Encounter (Signed)
Called pt and family member answered phone call and advised if Connor RuizJohn can leave us another urine specimen for culture and if he can't leave us one we will repeat for follow up appointment. Nisha

## 2015-06-22 NOTE — Assessment & Plan Note (Signed)
May be source of symptoms. Consider urology referral if symptoms persist.

## 2015-06-22 NOTE — Addendum Note (Signed)
Addended byLaroy Apple: Myrtie Leuthold L on: 06/22/2015 03:46 PM   Modules accepted: SmartSet

## 2015-06-22 NOTE — Progress Notes (Addendum)
Subjective:    Patient ID: Connor Lewis, male    DOB: [birth year], 75 y.o.   MRN: [id number]  HPI: Connor Lewis is a 75 y.o. male presenting on 06/22/2015 for Urinary Tract Infection   HPI   Here for follow up of UTI. Symptoms have not improved - might be worse. He has dribbling, frequency, and urgency. No dysuria or hematuria. Lower abdominal pain that is intermittent. Urine is cloudy. No fever, chills, diaphoresis, nausea, or vomiting. No changes in stool - no blood. Has had previous issues with urinary tract infections.    He also states that he feels SOB every time he gets up to the bathroom - does not move fast. Using combivent and flovent every day. Using albuterol just about every day. Productive cough - unchanged sputum, has noticed increased after nebulizer. Does well with inhalers.    Past Medical History  Diagnosis Date  . Chronic bronchiolitis (HCC)   . Diverticulitis   . Urine incontinence   . COPD (chronic obstructive pulmonary disease) (HCC)     Current Outpatient Prescriptions on File Prior to Visit  Medication Sig  . albuterol (PROVENTIL) (2.5 MG/3ML) 0.083% nebulizer solution Take 3 mLs (2.5 mg total) by nebulization every 6 (six) hours as needed for wheezing or shortness of breath.  Marland Kitchen. atorvastatin (LIPITOR) 40 MG tablet Take 1 tablet (40 mg total) by mouth daily.  . carbamide peroxide (EAR WAX REMOVAL DROPS) 6.5 % otic solution Place 5 drops into both ears 2 (two) times daily.  . fluticasone (FLONASE) 50 MCG/ACT nasal spray Place 2 sprays into both nostrils daily.  . fluticasone (FLOVENT HFA) 220 MCG/ACT inhaler Inhale 2 puffs into the lungs 2 (two) times daily.  Marland Kitchen. gabapentin (NEURONTIN) 100 MG capsule Take 100 mg by mouth 3 (three) times daily.  . Ipratropium-Albuterol (COMBIVENT) 20-100 MCG/ACT AERS respimat Inhale 1 puff into the lungs every 6 (six) hours.  . Loratadine 10 MG CAPS Take 1 capsule (10 mg total) by mouth daily.  . tamsulosin (FLOMAX) 0.4 MG CAPS  capsule Take 1 capsule (0.4 mg total) by mouth at bedtime.   No current facility-administered medications on file prior to visit.    Review of Systems  Constitutional: Negative for fever, chills, diaphoresis and activity change.  HENT: Negative for dental problem and hearing loss.   Eyes: Negative for visual disturbance.  Respiratory: Positive for cough and shortness of breath.   Cardiovascular: Negative for chest pain.  Gastrointestinal: Positive for abdominal pain and rectal pain. Negative for nausea, vomiting and blood in stool.  Genitourinary: Positive for urgency, frequency and difficulty urinating. Negative for hematuria, flank pain and enuresis.  Musculoskeletal: Negative for myalgias, back pain and arthralgias.  Skin: Negative for color change.  Neurological: Negative for syncope and headaches.  Psychiatric/Behavioral: Positive for sleep disturbance.   Per HPI unless specifically indicated above     Objective:    BP 123/75 mmHg  Pulse 81  Temp(Src) 98.3 F (36.8 C) (Oral)  Resp 16  Ht 6\' 1"  (1.854 m)  Wt 128 lb (58.06 kg)  BMI 16.89 kg/m2  SpO2 96%  Wt Readings from Last 3 Encounters:  06/22/15 128 lb (58.06 kg)  06/07/15 130 lb (58.968 kg)  05/11/15 128 lb 12.8 oz (58.423 kg)    Physical Exam  Constitutional: He is oriented to person, place, and time. Vital signs are normal. He appears well-developed. No distress.  HENT:  Head: Normocephalic.  Neck: Normal range of motion.  Cardiovascular: Normal rate, regular rhythm  and normal heart sounds.   Pulmonary/Chest: Effort normal. He has decreased breath sounds.  Abdominal: Soft. Bowel sounds are normal. He exhibits no distension and no mass. There is no tenderness. There is CVA tenderness (right side- mild. pt "stated he felt more on R than L" not described as painful). There is no rebound and no guarding.  Musculoskeletal: Normal range of motion.  Neurological: He is alert and oriented to person, place, and time.    Skin: Skin is warm and dry.  Psychiatric: He has a normal mood and affect. His behavior is normal.   Results for orders placed or performed in visit on 06/22/15  POCT Urinalysis Dipstick  Result Value Ref Range   Color, UA yellow    Clarity, UA clear    Glucose, UA negative    Bilirubin, UA negative    Ketones, UA negative    Spec Grav, UA 1.015    Blood, UA negative    pH, UA 7.0    Protein, UA negative    Urobilinogen, UA negative    Nitrite, UA negative    Leukocytes, UA Negative Negative      Assessment & Plan:   Problem List Items Addressed This Visit      Genitourinary   BPH (benign prostatic hyperplasia)    May be source of symptoms. Consider urology referral if symptoms persist.        Other Visit Diagnoses    UTI (lower urinary tract infection)    -  Primary    Trial of bactrim to see if symptoms decrease since he just completed cipro. Alarm symptoms reviewed. Refer to urology if symptoms persistent. Follow-up 2 weeks    Relevant Medications    sulfamethoxazole-trimethoprim (BACTRIM DS,SEPTRA DS) 800-160 MG tablet    Other Relevant Orders    POCT Urinalysis Dipstick (Completed)    CULTURE, URINE COMPREHENSIVE       Meds ordered this encounter  Medications  . DISCONTD: VENTOLIN HFA 108 (90 Base) MCG/ACT inhaler    Sig:   . sulfamethoxazole-trimethoprim (BACTRIM DS,SEPTRA DS) 800-160 MG tablet    Sig: Take 1 tablet by mouth 2 (two) times daily.    Dispense:  14 tablet    Refill:  0    Order Specific Question:  Supervising Provider    Answer:  Janeann Forehand [[id number]]      Follow up plan: Return in about 2 weeks (around 07/06/2015) for Urinary symptoms. .   Medical screening examination/treatment/procedure(s) were conducted as a shared visit with  [facility] [employer] NP student and myself.  I personally evaluated the patient during the encounter and supervised the plan.

## 2015-06-23 DIAGNOSIS — N390 Urinary tract infection, site not specified: Secondary | ICD-10-CM | POA: Diagnosis not present

## 2015-06-23 NOTE — Addendum Note (Signed)
Addended by: Elvina MattesPATEL, NISHABEN D on: 06/23/2015 01:31 PM   Modules accepted: Kipp BroodSmartSet

## 2015-06-26 LAB — CULTURE, URINE COMPREHENSIVE

## 2015-06-26 LAB — PLEASE NOTE

## 2015-06-27 ENCOUNTER — Telehealth: Payer: Self-pay | Admitting: Family Medicine

## 2015-06-27 MED ORDER — AMOXICILLIN-POT CLAVULANATE 875-125 MG PO TABS: 1.0000 | ORAL_TABLET | Freq: Two times a day (BID) | ORAL | Status: DC

## 2015-06-27 NOTE — Telephone Encounter (Signed)
Please advise him to stop taking and we will change his antibiotic to Augmentin twice daily for 3 days. Thanks! AK

## 2015-06-27 NOTE — Telephone Encounter (Signed)
Pt advised and he will pick up a new Rx and he has used Augmentin before which doesn't give him any side effect.

## 2015-06-27 NOTE — Telephone Encounter (Signed)
Pt. Called states that medicine that was given  to him last week he took 5 pills he was having abdominal pain  Once he stopped taken pill he did not have any more pain so pt wanted to know what to do. Pt call back #[phone].

## 2015-06-29 ENCOUNTER — Telehealth: Payer: Self-pay | Admitting: *Deleted

## 2015-07-06 ENCOUNTER — Ambulatory Visit (INDEPENDENT_AMBULATORY_CARE_PROVIDER_SITE_OTHER): Payer: Medicare Other | Admitting: Family Medicine

## 2015-07-06 ENCOUNTER — Encounter: Payer: Self-pay | Admitting: Family Medicine

## 2015-07-06 VITALS — BP 105/73 | HR 83 | Temp 98.4°F | Resp 16 | Ht 73.0 in | Wt 129.0 lb

## 2015-07-06 DIAGNOSIS — G4734 Idiopathic sleep related nonobstructive alveolar hypoventilation: Secondary | ICD-10-CM | POA: Diagnosis not present

## 2015-07-06 DIAGNOSIS — R600 Localized edema: Secondary | ICD-10-CM | POA: Diagnosis not present

## 2015-07-06 DIAGNOSIS — R351 Nocturia: Secondary | ICD-10-CM

## 2015-07-06 DIAGNOSIS — J42 Unspecified chronic bronchitis: Secondary | ICD-10-CM

## 2015-07-06 DIAGNOSIS — N401 Enlarged prostate with lower urinary tract symptoms: Secondary | ICD-10-CM | POA: Diagnosis not present

## 2015-07-06 MED ORDER — TAMSULOSIN HCL 0.4 MG PO CAPS: 0.8000 mg | ORAL_CAPSULE | Freq: Every day | ORAL | Status: DC

## 2015-07-06 MED ORDER — ALBUTEROL SULFATE HFA 108 (90 BASE) MCG/ACT IN AERS: 2.0000 | INHALATION_SPRAY | Freq: Four times a day (QID) | RESPIRATORY_TRACT | Status: AC | PRN

## 2015-07-06 NOTE — Assessment & Plan Note (Signed)
On 2LNC for night time hypoxia.

## 2015-07-06 NOTE — Assessment & Plan Note (Addendum)
Spirometry shows severe obstruction with improvement with bronchodilator. Continue current inhalers. Continue night-time oxygen. Consider pulmonology referral with patient increase in dyspnea. 08/04/15 Update: Starting patient on Brovana and Budesomide BID via nebulizer for COPD.

## 2015-07-06 NOTE — Patient Instructions (Signed)
Increase your Flomax to 2 capsules 30 minutes after dinner. We will see if this helps your symptoms. If not, we will have you see a urologist.   Oxygen- we will have [facility] take a look at the O2 to make sure it's working. If so, we will get a sleep study to determine if you have sleep apnea.

## 2015-07-06 NOTE — Assessment & Plan Note (Signed)
Trial of increasing flomax to 0.8mg  daily. Recheck 1 mos. Educated pt on risk of orthstatic hypotension. Pt will call with dizziness. Change position slowly. Consider urology referral if symptoms not improved.

## 2015-07-06 NOTE — Progress Notes (Addendum)
Subjective:    Patient ID: Connor Lewis, male    DOB: [birth year], 75 y.o.   MRN: [id number]  HPI: Connor CarasJohn Makin is a 75 y.o. male presenting on 07/06/2015 for COPD   HPI  Pt presents for COPD follow-up. Spirometry will be done today. Breathing is doing all right. Some days better than others. No increase in dyspnea. Cough is productive of sputum after nebulizer. Using nebulizers 3-4 times per day- not for shortness of breath but because he feels like he should. Eases his breathing. Wearing O2 2LNC at bedtime. Is worried it is not working.   Urinary symptoms: Improved with amoxcillin.  Sometimes urinating 10-12 times per night. No dysuria. Has flomax takes 1 hour before he goes to bed for known BPH. Lots of nocturia. Goes to bed 10pm. Drinks one cup coffee per day.   Worried about feet swelling. Goes away with elevating the feet.   Past Medical History  Diagnosis Date  . Chronic bronchiolitis (HCC)   . Diverticulitis   . Urine incontinence   . COPD (chronic obstructive pulmonary disease) (HCC)     No current facility-administered medications on file prior to visit.   Current Outpatient Prescriptions on File Prior to Visit  Medication Sig  . albuterol (PROVENTIL) (2.5 MG/3ML) 0.083% nebulizer solution Take 3 mLs (2.5 mg total) by nebulization every 6 (six) hours as needed for wheezing or shortness of breath.  Marland Kitchen. atorvastatin (LIPITOR) 40 MG tablet Take 1 tablet (40 mg total) by mouth daily.  . carbamide peroxide (EAR WAX REMOVAL DROPS) 6.5 % otic solution Place 5 drops into both ears 2 (two) times daily. (Patient taking differently: Place 5 drops into both ears 2 (two) times daily as needed (ear wax). )  . fluticasone (FLONASE) 50 MCG/ACT nasal spray Place 2 sprays into both nostrils daily.  . fluticasone (FLOVENT HFA) 220 MCG/ACT inhaler Inhale 2 puffs into the lungs 2 (two) times daily.  Marland Kitchen. gabapentin (NEURONTIN) 100 MG capsule Take 100 mg by mouth 3 (three) times daily as needed (nerve  pain).   . Ipratropium-Albuterol (COMBIVENT) 20-100 MCG/ACT AERS respimat Inhale 1 puff into the lungs every 6 (six) hours.    Review of Systems Per HPI unless specifically indicated above     Objective:    BP 105/73 mmHg  Pulse 83  Temp(Src) 98.4 F (36.9 C) (Oral)  Resp 16  Ht 6\' 1"  (1.854 m)  Wt 129 lb (58.514 kg)  BMI 17.02 kg/m2  SpO2 96%  Wt Readings from Last 3 Encounters:  08/04/15 126 lb 4.8 oz (57.289 kg)  07/06/15 129 lb (58.514 kg)  06/22/15 128 lb (58.06 kg)    Physical Exam  Constitutional: He is oriented to person, place, and time. He appears well-developed and well-nourished. No distress.  HENT:  Head: Normocephalic and atraumatic.  Neck: Neck supple. No thyromegaly present.  Cardiovascular: Normal rate, regular rhythm and normal heart sounds.  Exam reveals no gallop and no friction rub.   No murmur heard. Pulmonary/Chest: Effort normal. He has decreased breath sounds in the right lower field and the left lower field. He has no wheezes. He has no rhonchi. He has no rales. Chest wall is not dull to percussion. He exhibits no tenderness.  Hyperresonant to percussion.   Abdominal: Soft. Bowel sounds are normal. He exhibits no distension. There is no tenderness. There is no rebound and no CVA tenderness.  Musculoskeletal: Normal range of motion. He exhibits edema (trace edema bilateral ankles). He exhibits no tenderness.  Neurological: He is alert and oriented to person, place, and time. He has normal reflexes.  Skin: Skin is warm and dry. No rash noted. No erythema.  Psychiatric: He has a normal mood and affect. His behavior is normal. Thought content normal.   Results for orders placed or performed in visit on 06/22/15  CULTURE, URINE COMPREHENSIVE  Result Value Ref Range   Urine Culture, Comprehensive Final report (A)    Result 1 Comment (A)    Result 2 Comment    ANTIMICROBIAL SUSCEPTIBILITY Comment   Please Note  Result Value Ref Range   Please note  Comment   POCT Urinalysis Dipstick  Result Value Ref Range   Color, UA yellow    Clarity, UA clear    Glucose, UA negative    Bilirubin, UA negative    Ketones, UA negative    Spec Grav, UA 1.015    Blood, UA negative    pH, UA 7.0    Protein, UA negative    Urobilinogen, UA negative    Nitrite, UA negative    Leukocytes, UA Negative Negative      Assessment & Plan:   Problem List Items Addressed This Visit      Respiratory   Chronic bronchitis (HCC) - Primary    Spirometry shows severe obstruction with improvement with bronchodilator. Continue current inhalers. Continue night-time oxygen. Consider pulmonology referral with patient increase in dyspnea. 08/04/15 Update: Starting patient on Brovana and Budesomide BID via nebulizer for COPD.        Relevant Medications   albuterol (PROVENTIL HFA;VENTOLIN HFA) 108 (90 Base) MCG/ACT inhaler   Other Relevant Orders   Spirometry: Pre & Post Eval (Completed)     Genitourinary   BPH associated with nocturia    Trial of increasing flomax to 0.8mg  daily. Recheck 1 mos. Educated pt on risk of orthstatic hypotension. Pt will call with dizziness. Change position slowly. Consider urology referral if symptoms not improved.       Relevant Medications   tamsulosin (FLOMAX) 0.4 MG CAPS capsule     Other   Hypoxia, sleep related    On 2LNC for night time hypoxia.        Other Visit Diagnoses    Pedal edema        Mild. Pt encouraged to elevate feet when sitting down. Wear compression stockings. Alarm symptoms reviewed.        Meds ordered this encounter  Medications  . tamsulosin (FLOMAX) 0.4 MG CAPS capsule    Sig: Take 2 capsules (0.8 mg total) by mouth at bedtime.    Dispense:  60 capsule    Refill:  11    Order Specific Question:  Supervising Provider    Answer:  Janeann Forehand [phone]  . albuterol (PROVENTIL HFA;VENTOLIN HFA) 108 (90 Base) MCG/ACT inhaler    Sig: Inhale 2 puffs into the lungs every 6 (six) hours as  needed for wheezing or shortness of breath.    Dispense:  1 Inhaler    Refill:  11    Order Specific Question:  Supervising Provider    Answer:  Janeann Forehand [phone]      Follow up plan: Return in about 4 weeks (around 08/03/2015) for nocturia. Marland Kitchen

## 2015-07-19 NOTE — Telephone Encounter (Signed)
Medication changed patient aware. Nothing further needed at this time.Connor Lewis

## 2015-08-03 ENCOUNTER — Emergency Department: Payer: Medicare Other

## 2015-08-03 ENCOUNTER — Inpatient Hospital Stay
Admission: EM | Admit: 2015-08-03 | Discharge: 2015-08-04 | DRG: 190 | Disposition: A | Discharge status: Home / Self Care | Payer: Medicare Other | Attending: Internal Medicine | Admitting: Internal Medicine

## 2015-08-03 DIAGNOSIS — Z79899 Other long term (current) drug therapy: Secondary | ICD-10-CM

## 2015-08-03 DIAGNOSIS — J441 Chronic obstructive pulmonary disease with (acute) exacerbation: Secondary | ICD-10-CM | POA: Diagnosis not present

## 2015-08-03 DIAGNOSIS — Z881 Allergy status to other antibiotic agents status: Secondary | ICD-10-CM

## 2015-08-03 DIAGNOSIS — N401 Enlarged prostate with lower urinary tract symptoms: Secondary | ICD-10-CM | POA: Diagnosis present

## 2015-08-03 DIAGNOSIS — J9611 Chronic respiratory failure with hypoxia: Secondary | ICD-10-CM | POA: Diagnosis present

## 2015-08-03 DIAGNOSIS — Z833 Family history of diabetes mellitus: Secondary | ICD-10-CM

## 2015-08-03 DIAGNOSIS — Z823 Family history of stroke: Secondary | ICD-10-CM

## 2015-08-03 DIAGNOSIS — Z9981 Dependence on supplemental oxygen: Secondary | ICD-10-CM | POA: Diagnosis present

## 2015-08-03 DIAGNOSIS — R0602 Shortness of breath: Secondary | ICD-10-CM | POA: Diagnosis not present

## 2015-08-03 DIAGNOSIS — J9621 Acute and chronic respiratory failure with hypoxia: Secondary | ICD-10-CM | POA: Diagnosis present

## 2015-08-03 DIAGNOSIS — Z882 Allergy status to sulfonamides status: Secondary | ICD-10-CM

## 2015-08-03 DIAGNOSIS — R339 Retention of urine, unspecified: Secondary | ICD-10-CM | POA: Diagnosis not present

## 2015-08-03 DIAGNOSIS — R338 Other retention of urine: Secondary | ICD-10-CM

## 2015-08-03 DIAGNOSIS — Z7951 Long term (current) use of inhaled steroids: Secondary | ICD-10-CM

## 2015-08-03 DIAGNOSIS — Z8249 Family history of ischemic heart disease and other diseases of the circulatory system: Secondary | ICD-10-CM

## 2015-08-03 LAB — COMPREHENSIVE METABOLIC PANEL
ALT: 19 U/L (ref 17–63)
ANION GAP: 8 (ref 5–15)
AST: 28 U/L (ref 15–41)
Albumin: 4.4 g/dL (ref 3.5–5.0)
Alkaline Phosphatase: 84 U/L (ref 38–126)
BILIRUBIN TOTAL: 0.5 mg/dL (ref 0.3–1.2)
BUN: 11 mg/dL (ref 6–20)
CO2: 31 mmol/L (ref 22–32)
Calcium: 9.5 mg/dL (ref 8.9–10.3)
Chloride: 101 mmol/L (ref 101–111)
Creatinine, Ser: 1.06 mg/dL (ref 0.61–1.24)
Glucose, Bld: 97 mg/dL (ref 65–99)
POTASSIUM: 3.5 mmol/L (ref 3.5–5.1)
Sodium: 140 mmol/L (ref 135–145)
TOTAL PROTEIN: 7.3 g/dL (ref 6.5–8.1)

## 2015-08-03 LAB — CBC
HEMATOCRIT: 41.5 % (ref 40.0–52.0)
Hemoglobin: 14 g/dL (ref 13.0–18.0)
MCH: 28.9 pg (ref 26.0–34.0)
MCHC: 33.7 g/dL (ref 32.0–36.0)
MCV: 86 fL (ref 80.0–100.0)
Platelets: 208 10*3/uL (ref 150–440)
RBC: 4.83 MIL/uL (ref 4.40–5.90)
RDW: 13.4 % (ref 11.5–14.5)
WBC: 8.2 10*3/uL (ref 3.8–10.6)

## 2015-08-03 MED ORDER — ALBUTEROL SULFATE (2.5 MG/3ML) 0.083% IN NEBU
5.0000 mg | INHALATION_SOLUTION | RESPIRATORY_TRACT | Status: AC
  Administered 2015-08-04: 5 mg via RESPIRATORY_TRACT
  Filled 2015-08-03: qty 6

## 2015-08-03 MED ORDER — IPRATROPIUM-ALBUTEROL 0.5-2.5 (3) MG/3ML IN SOLN
3.0000 mL | Freq: Once | RESPIRATORY_TRACT | Status: AC
  Administered 2015-08-04: 3 mL via RESPIRATORY_TRACT
  Filled 2015-08-03: qty 3

## 2015-08-03 MED ORDER — TAMSULOSIN HCL 0.4 MG PO CAPS
0.4000 mg | ORAL_CAPSULE | ORAL | Status: AC
  Administered 2015-08-04: 0.4 mg via ORAL
  Filled 2015-08-03: qty 1

## 2015-08-03 NOTE — ED Notes (Signed)
Pt states he's had shortness of breath off and on for a week.  Pt states that shortness of breath has gotten worse recently in the past few days.  Pt tachypneic with exertion.  Pt denies any history of heart failure, but states that his feet have been swelling bilaterally recently.

## 2015-08-03 NOTE — ED Notes (Signed)
Unable to print EKG, EKG exported and EDP notified and looked at EKG on computer.

## 2015-08-03 NOTE — ED Provider Notes (Signed)
[facility] Emergency Department Provider Note  ____________________________________________  Time seen: Approximately 11:06 PM  I have reviewed the triage vital signs and the nursing notes.   HISTORY  Chief Complaint COPD  EM caveat: Some history and review of systems somewhat limited due to severity of patient's dyspnea  HPI Connor Lewis is a 75 y.o. male history of COPD and urinary retention.  Patient reports for the last week increasing shortness of breath, wheezing, and dyspnea. Much worse with any exertion, but over the last couple of days it been very prominent even at rest.  He's been feeling very short of breath. He is having no chest pain. He has some slight swelling in his legs over the last 2 months.  Rates moderate shortness of breath, slightly better after treatments with EMS. Still feels ongoing shortness of breath at rest.  Past Medical History  Diagnosis Date  . Chronic bronchiolitis (HCC)   . Diverticulitis   . Urine incontinence   . COPD (chronic obstructive pulmonary disease) [facility])     Patient Active Problem List   Diagnosis Date Noted  . Hypoxia, sleep related 07/06/2015  . Allergic rhinitis 05/11/2015  . Prediabetes 05/11/2015  . Chronic bronchitis (HCC) 04/07/2015  . BPH associated with nocturia 04/07/2015  . Peripheral neuropathy (HCC) 04/07/2015    History reviewed. No pertinent past surgical history.  Current Outpatient Rx  Name  Route  Sig  Dispense  Refill  . albuterol (PROVENTIL HFA;VENTOLIN HFA) 108 (90 Base) MCG/ACT inhaler   Inhalation   Inhale 2 puffs into the lungs every 6 (six) hours as needed for wheezing or shortness of breath.   1 Inhaler   11   . albuterol (PROVENTIL) (2.5 MG/3ML) 0.083% nebulizer solution   Nebulization   Take 3 mLs (2.5 mg total) by nebulization every 6 (six) hours as needed for wheezing or shortness of breath.   150 mL   1   . atorvastatin (LIPITOR) 40 MG tablet   Oral   Take  1 tablet (40 mg total) by mouth daily.   90 tablet   3   . carbamide peroxide (EAR WAX REMOVAL DROPS) 6.5 % otic solution   Both Ears   Place 5 drops into both ears 2 (two) times daily. Patient taking differently: Place 5 drops into both ears 2 (two) times daily as needed (ear wax).    15 mL   0   . fluticasone (FLONASE) 50 MCG/ACT nasal spray   Each Nare   Place 2 sprays into both nostrils daily.   16 g   11   . fluticasone (FLOVENT HFA) 220 MCG/ACT inhaler   Inhalation   Inhale 2 puffs into the lungs 2 (two) times daily.   1 Inhaler   12   . gabapentin (NEURONTIN) 100 MG capsule   Oral   Take 100 mg by mouth 3 (three) times daily as needed (nerve pain).          . Ipratropium-Albuterol (COMBIVENT) 20-100 MCG/ACT AERS respimat   Inhalation   Inhale 1 puff into the lungs every 6 (six) hours.   1 Inhaler   11   . OXYGEN   Inhalation   Inhale 2-3 L into the lungs continuous.         . tamsulosin (FLOMAX) 0.4 MG CAPS capsule   Oral   Take 2 capsules (0.8 mg total) by mouth at bedtime.   60 capsule   11     Allergies Doxycycline and Sulfa  antibiotics  Family History  Problem Relation Age of Onset  . Stroke Mother   . Diabetes Mother   . Heart disease Father     Social History Social History  Substance Use Topics  . Smoking status: Never Smoker   . Smokeless tobacco: None  . Alcohol Use: Yes    Review of Systems Constitutional: No fever/chills Eyes: No visual changes. ENT: No sore throat. Cardiovascular: Denies chest pain. Respiratory: Denies shortness of breath. Gastrointestinal: No abdominal pain.  No nausea, no vomiting.  Genitourinary: Negative for dysuria but reports he is unable to urinate for the last several days, except with severe straining. Musculoskeletal: Negative for back pain. Neurological: Negative for headaches, focal weakness or numbness.  10-point ROS otherwise  negative.  ____________________________________________   PHYSICAL EXAM:  VITAL SIGNS: ED Triage Vitals  Enc Vitals Group     BP 08/03/15 2234 183/135 mmHg     Pulse Rate 08/03/15 2234 103     Resp 08/03/15 2234 20     Temp 08/03/15 2234 98.3 F (36.8 C)     Temp Source 08/03/15 2234 Oral     SpO2 08/03/15 2234 98 %     Weight 08/03/15 2234 125 lb (56.7 kg)     Height 08/03/15 2234 5\' 11"  (1.803 m)     Head Cir --      Peak Flow --      Pain Score --      Pain Loc --      Pain Edu? --      Excl. in GC? --    Constitutional: Alert and oriented. Moderate increased work of breathing, sitting upright in a near tripod position with use of accessory muscles began very short one to 2 word phrases. Eyes: Conjunctivae are normal. PERRL. EOMI. Head: Atraumatic. Nose: No congestion/rhinnorhea. Mouth/Throat: Mucous membranes are moist.  Oropharynx non-erythematous. Neck: No stridor.   Cardiovascular: Tachycardic rate, regular rhythm. Grossly normal heart sounds.  Good peripheral circulation. Respiratory: Moderate increased work of breathing, short phrases, using accessory muscles. No impending respiratory compromise, however the patient does demonstrate evidence of moderate increased work of breathing. The patient's end-tidal CO2 was measured at 30 with a clear "sharks finned" appearance. Gastrointestinal: Soft and nontender. No distention. No abdominal bruits. No CVA tenderness. Musculoskeletal: No lower extremity tenderness. Neurologic:  Normal speech and language. No gross focal neurologic deficits are appreciated.Skin:  Skin is warm, dry and intact. No rash noted. Chase lower extremity edema bilateral. Psychiatric: Mood and affect are normal. Speech and behavior are normal.  ____________________________________________   LABS (all labs ordered are listed, but only abnormal results are displayed)  Labs Reviewed  BLOOD GAS, VENOUS - Abnormal; Notable for the following:    pH, Ven  7.49 (*)    pCO2, Ven 38 (*)    pO2, Ven 91.0 (*)    Bicarbonate 29.0 (*)    Acid-Base Excess 5.4 (*)    All other components within normal limits  URINALYSIS COMPLETEWITH MICROSCOPIC ([facility] ONLY) - Abnormal; Notable for the following:    Color, Urine STRAW (*)    APPearance CLEAR (*)    Glucose, UA 50 (*)    Hgb urine dipstick 1+ (*)    Protein, ur 30 (*)    Bacteria, UA RARE (*)    Squamous Epithelial / LPF 0-5 (*)    All other components within normal limits  CBC  COMPREHENSIVE METABOLIC PANEL  TROPONIN I  BRAIN NATRIURETIC PEPTIDE   ____________________________________________  EKG  Reviewed and interpreted by me at 2230 Ventricular rate 100 PR 127 QRS 94 QTc 470 Nonspecific T-wave abnormality seen with questionable slight depression in inferior leads. Reviewed and interpreted as sinus tachycardia and possible slight ischemic change in the inferior leads, however patient does not do note any chest pain at this time. ____________________________________________  RADIOLOGY  DG Chest 2 View (Final result) Result time: 08/03/15 23:17:58   Final result by Rad Results In Interface (08/03/15 23:17:58)   Narrative:   CLINICAL DATA: Shortness of breath  EXAM: CHEST 2 VIEW  COMPARISON: None.  FINDINGS: Hyperinflation of lungs with flattening of the diaphragm consistent with COPD/emphysema. No nodules or masses. The heart, hila, and mediastinum are normal. No other acute abnormalities.  IMPRESSION: COPD/emphysema. No other abnormalities identified.   Electronically Signed By: Gerome Sam III M.D On: 08/03/2015 23:17    ____________________________________________   PROCEDURES  Procedure(s) performed: None  Critical Care performed: No  ____________________________________________   INITIAL IMPRESSION / ASSESSMENT AND PLAN / ED COURSE  Pertinent labs & imaging results that were available during my care of the patient were reviewed by me  and considered in my medical decision making (see chart for details).  ----------------------------------------- 2:13 AM on 08/04/2015 -----------------------------------------  The patient is showing signs of improvement, currently his work of breathing has improved significantly however he still remains slightly tachypnea, exam is respiratory rate currently 25 with mild use of accessory muscles now speaking in 4-5 word sentences. He reports he feels much better, his abdominal pain is improved, his breathing is improving. Based on the patient's ongoing mild increased work of breathing after multiple nebulizer treatments, steroids, we will admit him for COPD exacerbation. ____________________________________________   FINAL CLINICAL IMPRESSION(S) / ED DIAGNOSES  Final diagnoses:  Acute urinary retention  COPD exacerbation (HCC)      Sharyn Creamer, MD 08/04/15 [phone]

## 2015-08-03 NOTE — ED Notes (Signed)
Per EMS, patient given 125 solumedrol and 2 duonebs en route to ER.  Pt has tremor to L side, but states that this is chronic and that it is not worse at this time.

## 2015-08-04 ENCOUNTER — Other Ambulatory Visit: Payer: Self-pay | Admitting: Family Medicine

## 2015-08-04 ENCOUNTER — Encounter: Payer: Self-pay | Admitting: Internal Medicine

## 2015-08-04 DIAGNOSIS — J9611 Chronic respiratory failure with hypoxia: Secondary | ICD-10-CM | POA: Diagnosis present

## 2015-08-04 DIAGNOSIS — R338 Other retention of urine: Secondary | ICD-10-CM | POA: Diagnosis present

## 2015-08-04 DIAGNOSIS — J441 Chronic obstructive pulmonary disease with (acute) exacerbation: Secondary | ICD-10-CM | POA: Diagnosis present

## 2015-08-04 DIAGNOSIS — J9621 Acute and chronic respiratory failure with hypoxia: Secondary | ICD-10-CM | POA: Diagnosis present

## 2015-08-04 DIAGNOSIS — J410 Simple chronic bronchitis: Secondary | ICD-10-CM

## 2015-08-04 DIAGNOSIS — Z8249 Family history of ischemic heart disease and other diseases of the circulatory system: Secondary | ICD-10-CM | POA: Diagnosis not present

## 2015-08-04 DIAGNOSIS — E785 Hyperlipidemia, unspecified: Secondary | ICD-10-CM | POA: Diagnosis not present

## 2015-08-04 DIAGNOSIS — Z881 Allergy status to other antibiotic agents status: Secondary | ICD-10-CM | POA: Diagnosis not present

## 2015-08-04 DIAGNOSIS — Z79899 Other long term (current) drug therapy: Secondary | ICD-10-CM | POA: Diagnosis not present

## 2015-08-04 DIAGNOSIS — Z882 Allergy status to sulfonamides status: Secondary | ICD-10-CM | POA: Diagnosis not present

## 2015-08-04 DIAGNOSIS — Z833 Family history of diabetes mellitus: Secondary | ICD-10-CM | POA: Diagnosis not present

## 2015-08-04 DIAGNOSIS — Z823 Family history of stroke: Secondary | ICD-10-CM | POA: Diagnosis not present

## 2015-08-04 DIAGNOSIS — N401 Enlarged prostate with lower urinary tract symptoms: Secondary | ICD-10-CM | POA: Diagnosis present

## 2015-08-04 DIAGNOSIS — Z7951 Long term (current) use of inhaled steroids: Secondary | ICD-10-CM | POA: Diagnosis not present

## 2015-08-04 DIAGNOSIS — Z9981 Dependence on supplemental oxygen: Secondary | ICD-10-CM

## 2015-08-04 LAB — BLOOD GAS, VENOUS
Acid-Base Excess: 5.4 mmol/L — ABNORMAL HIGH (ref 0.0–3.0)
BICARBONATE: 29 meq/L — AB (ref 21.0–28.0)
O2 Saturation: 97.7 %
PATIENT TEMPERATURE: 37
PH VEN: 7.49 — AB (ref 7.320–7.430)
pCO2, Ven: 38 mmHg — ABNORMAL LOW (ref 44.0–60.0)
pO2, Ven: 91 mmHg — ABNORMAL HIGH (ref 31.0–45.0)

## 2015-08-04 LAB — HEMOGLOBIN A1C: Hgb A1c MFr Bld: 6.1 % — ABNORMAL HIGH (ref 4.0–6.0)

## 2015-08-04 LAB — URINALYSIS COMPLETE WITH MICROSCOPIC (ARMC ONLY)
Bilirubin Urine: NEGATIVE
Glucose, UA: 50 mg/dL — AB
Ketones, ur: NEGATIVE mg/dL
Leukocytes, UA: NEGATIVE
Nitrite: NEGATIVE
PROTEIN: 30 mg/dL — AB
Specific Gravity, Urine: 1.005 (ref 1.005–1.030)
pH: 8 (ref 5.0–8.0)

## 2015-08-04 LAB — BRAIN NATRIURETIC PEPTIDE: B Natriuretic Peptide: 57 pg/mL (ref 0.0–100.0)

## 2015-08-04 LAB — TSH: TSH: 6.19 u[IU]/mL — ABNORMAL HIGH (ref 0.350–4.500)

## 2015-08-04 LAB — TROPONIN I

## 2015-08-04 MED ORDER — IPRATROPIUM-ALBUTEROL 0.5-2.5 (3) MG/3ML IN SOLN
3.0000 mL | Freq: Once | RESPIRATORY_TRACT | Status: AC
  Administered 2015-08-04: 3 mL via RESPIRATORY_TRACT
  Filled 2015-08-04: qty 3

## 2015-08-04 MED ORDER — AZITHROMYCIN 250 MG PO TABS
500.0000 mg | ORAL_TABLET | Freq: Every day | ORAL | Status: AC
  Administered 2015-08-04: 09:00:00 500 mg via ORAL
  Filled 2015-08-04: qty 2

## 2015-08-04 MED ORDER — PNEUMOCOCCAL VAC POLYVALENT 25 MCG/0.5ML IJ INJ: 0.5000 mL | INJECTION | INTRAMUSCULAR | Status: DC

## 2015-08-04 MED ORDER — BUDESONIDE 0.5 MG/2ML IN SUSP: 0.5000 mg | Freq: Two times a day (BID) | RESPIRATORY_TRACT | Status: DC

## 2015-08-04 MED ORDER — CARBAMIDE PEROXIDE 6.5 % OT SOLN
5.0000 [drp] | Freq: Two times a day (BID) | OTIC | Status: DC | PRN
Start: 2015-08-04 — End: 2015-08-04

## 2015-08-04 MED ORDER — ONDANSETRON HCL 4 MG/2ML IJ SOLN: 4.0000 mg | Freq: Four times a day (QID) | INTRAMUSCULAR | Status: DC | PRN

## 2015-08-04 MED ORDER — GABAPENTIN 100 MG PO CAPS
100.0000 mg | ORAL_CAPSULE | Freq: Three times a day (TID) | ORAL | Status: DC | PRN
  Administered 2015-08-04: 100 mg via ORAL
  Filled 2015-08-04: qty 1

## 2015-08-04 MED ORDER — ATORVASTATIN CALCIUM 20 MG PO TABS
40.0000 mg | ORAL_TABLET | Freq: Every day | ORAL | Status: DC
  Administered 2015-08-04: 40 mg via ORAL
  Filled 2015-08-04: qty 2

## 2015-08-04 MED ORDER — ALBUTEROL SULFATE HFA 108 (90 BASE) MCG/ACT IN AERS: 2.0000 | INHALATION_SPRAY | Freq: Four times a day (QID) | RESPIRATORY_TRACT | Status: DC | PRN

## 2015-08-04 MED ORDER — MORPHINE SULFATE (PF) 2 MG/ML IV SOLN: 1.0000 mg | INTRAVENOUS | Status: DC | PRN

## 2015-08-04 MED ORDER — PREDNISONE 50 MG PO TABS: 50.0000 mg | ORAL_TABLET | Freq: Every day | ORAL | Status: DC

## 2015-08-04 MED ORDER — ACETAMINOPHEN 650 MG RE SUPP: 650.0000 mg | Freq: Four times a day (QID) | RECTAL | Status: DC | PRN

## 2015-08-04 MED ORDER — FLUTICASONE PROPIONATE HFA 220 MCG/ACT IN AERO: 2.0000 | INHALATION_SPRAY | Freq: Two times a day (BID) | RESPIRATORY_TRACT | Status: DC

## 2015-08-04 MED ORDER — TAMSULOSIN HCL 0.4 MG PO CAPS: 0.8000 mg | ORAL_CAPSULE | Freq: Every day | ORAL | Status: DC

## 2015-08-04 MED ORDER — AZITHROMYCIN 250 MG PO TABS: 250.0000 mg | ORAL_TABLET | Freq: Every day | ORAL | Status: DC

## 2015-08-04 MED ORDER — PREDNISONE 20 MG PO TABS: 40.0000 mg | ORAL_TABLET | Freq: Every day | ORAL | Status: DC

## 2015-08-04 MED ORDER — PREDNISONE 20 MG PO TABS: 20.0000 mg | ORAL_TABLET | Freq: Every day | ORAL | Status: DC

## 2015-08-04 MED ORDER — ENOXAPARIN SODIUM 40 MG/0.4ML ~~LOC~~ SOLN: 40.0000 mg | SUBCUTANEOUS | Status: DC

## 2015-08-04 MED ORDER — PREDNISONE 20 MG PO TABS: 30.0000 mg | ORAL_TABLET | Freq: Every day | ORAL | Status: DC

## 2015-08-04 MED ORDER — BUDESONIDE 0.5 MG/2ML IN SUSP
0.5000 mg | Freq: Two times a day (BID) | RESPIRATORY_TRACT | Status: DC
  Administered 2015-08-04: 08:00:00 0.5 mg via RESPIRATORY_TRACT
  Filled 2015-08-04: qty 2

## 2015-08-04 MED ORDER — ONDANSETRON HCL 4 MG PO TABS: 4.0000 mg | ORAL_TABLET | Freq: Four times a day (QID) | ORAL | Status: DC | PRN

## 2015-08-04 MED ORDER — LEVOFLOXACIN 500 MG PO TABS: 500.0000 mg | ORAL_TABLET | Freq: Every day | ORAL | Status: DC

## 2015-08-04 MED ORDER — ALBUTEROL SULFATE (2.5 MG/3ML) 0.083% IN NEBU: 2.5000 mg | INHALATION_SOLUTION | Freq: Four times a day (QID) | RESPIRATORY_TRACT | Status: DC | PRN

## 2015-08-04 MED ORDER — FLUTICASONE PROPIONATE 50 MCG/ACT NA SUSP
2.0000 | Freq: Every day | NASAL | Status: DC
  Administered 2015-08-04: 2 via NASAL
  Filled 2015-08-04: qty 16

## 2015-08-04 MED ORDER — PREDNISONE 50 MG PO TABS
50.0000 mg | ORAL_TABLET | Freq: Every day | ORAL | Status: AC
  Administered 2015-08-04: 08:00:00 50 mg via ORAL
  Filled 2015-08-04: qty 1

## 2015-08-04 MED ORDER — ARFORMOTEROL TARTRATE 15 MCG/2ML IN NEBU: 15.0000 ug | INHALATION_SOLUTION | Freq: Two times a day (BID) | RESPIRATORY_TRACT | Status: DC

## 2015-08-04 MED ORDER — PREDNISONE 10 MG PO TABS: 10.0000 mg | ORAL_TABLET | Freq: Every day | ORAL | Status: DC

## 2015-08-04 MED ORDER — GABAPENTIN 100 MG PO CAPS: 100.0000 mg | ORAL_CAPSULE | Freq: Three times a day (TID) | ORAL | Status: DC | PRN

## 2015-08-04 MED ORDER — ACETAMINOPHEN 325 MG PO TABS
650.0000 mg | ORAL_TABLET | Freq: Four times a day (QID) | ORAL | Status: DC | PRN
Start: 2015-08-04 — End: 2015-08-04

## 2015-08-04 MED ORDER — DOCUSATE SODIUM 100 MG PO CAPS
100.0000 mg | ORAL_CAPSULE | Freq: Two times a day (BID) | ORAL | Status: DC
Start: 2015-08-04 — End: 2015-08-04
  Administered 2015-08-04: 09:00:00 100 mg via ORAL
  Filled 2015-08-04: qty 1

## 2015-08-04 MED ORDER — IPRATROPIUM-ALBUTEROL 0.5-2.5 (3) MG/3ML IN SOLN
3.0000 mL | RESPIRATORY_TRACT | Status: DC
  Administered 2015-08-04 (×4): 3 mL via RESPIRATORY_TRACT
  Filled 2015-08-04 (×4): qty 3

## 2015-08-04 NOTE — H&P (Signed)
Connor Lewis is an 75 y.o. male.   Chief Complaint: Shortness of breath HPI: The patient with past medical history of COPD presents to emergency department complaining of shortness of breath. He states that he has noticed an increasing amount of dyspnea with exertion particularly when he strains to urinate. For the last week he has been taking 2 Flomax tablets per the direction of his primary care doctor to help with his urinary retention. He has gotten some relief from this new regimen but has been progressively more short of breath over this period of time. He admits to cough and increased sputum production. This evening prior to admission he became so short of breath that he had to "turn his oxygen all the way up". He denies chest pain but admits to some abdominal or pelvic fullness. The patient had a urinary catheter placed which drained more than a liter of urine. He also received Solu-Medrol as well as multiple breathing treatments in the emergency department prior to the hospitalist service been contacted for further management.  Past Medical History  Diagnosis Date  . Chronic bronchiolitis ([place])   . Diverticulitis   . Urine incontinence   . COPD (chronic obstructive pulmonary disease) [facility])     Past Surgical History  Procedure Laterality Date  . None      Family History  Problem Relation Age of Onset  . Stroke Mother   . Diabetes Mother   . Heart disease Father    Social History:  reports that he has never smoked. He does not have any smokeless tobacco history on file. He reports that he drinks alcohol. He reports that he does not use illicit drugs.  Allergies:  Allergies  Allergen Reactions  . Doxycycline Anaphylaxis and Rash    Given [facility]pen at [facility]  . Sulfa Antibiotics Other (See Comments)    pain    Medications Prior to Admission  Medication Sig Dispense Refill  . albuterol (PROVENTIL HFA;VENTOLIN HFA) 108 (90 Base) MCG/ACT inhaler Inhale 2 puffs into the lungs every 6  (six) hours as needed for wheezing or shortness of breath. 1 Inhaler 11  . albuterol (PROVENTIL) (2.5 MG/3ML) 0.083% nebulizer solution Take 3 mLs (2.5 mg total) by nebulization every 6 (six) hours as needed for wheezing or shortness of breath. 150 mL 1  . atorvastatin (LIPITOR) 40 MG tablet Take 1 tablet (40 mg total) by mouth daily. 90 tablet 3  . carbamide peroxide (EAR WAX REMOVAL DROPS) 6.5 % otic solution Place 5 drops into both ears 2 (two) times daily. (Patient taking differently: Place 5 drops into both ears 2 (two) times daily as needed (ear wax). ) 15 mL 0  . fluticasone (FLONASE) 50 MCG/ACT nasal spray Place 2 sprays into both nostrils daily. 16 g 11  . fluticasone (FLOVENT HFA) 220 MCG/ACT inhaler Inhale 2 puffs into the lungs 2 (two) times daily. 1 Inhaler 12  . gabapentin (NEURONTIN) 100 MG capsule Take 100 mg by mouth 3 (three) times daily as needed (nerve pain).     . Ipratropium-Albuterol (COMBIVENT) 20-100 MCG/ACT AERS respimat Inhale 1 puff into the lungs every 6 (six) hours. 1 Inhaler 11  . OXYGEN Inhale 2-3 L into the lungs continuous.    . tamsulosin (FLOMAX) 0.4 MG CAPS capsule Take 2 capsules (0.8 mg total) by mouth at bedtime. 60 capsule 11    Results for orders placed or performed during the hospital encounter of 08/03/15 (from the past 48 hour(s))  CBC  Status: None   Collection Time: 08/03/15 10:37 PM  Result Value Ref Range   WBC 8.2 3.8 - 10.6 K/uL   RBC 4.83 4.40 - 5.90 MIL/uL   Hemoglobin 14.0 13.0 - 18.0 g/dL   HCT 41.5 40.0 - 52.0 %   MCV 86.0 80.0 - 100.0 fL   MCH 28.9 26.0 - 34.0 pg   MCHC 33.7 32.0 - 36.0 g/dL   RDW 13.4 11.5 - 14.5 %   Platelets 208 150 - 440 K/uL  Comprehensive metabolic panel     Status: None   Collection Time: 08/03/15 10:37 PM  Result Value Ref Range   Sodium 140 135 - 145 mmol/L   Potassium 3.5 3.5 - 5.1 mmol/L   Chloride 101 101 - 111 mmol/L   CO2 31 22 - 32 mmol/L   Glucose, Bld 97 65 - 99 mg/dL   BUN 11 6 - 20 mg/dL    Creatinine, Ser 1.06 0.61 - 1.24 mg/dL   Calcium 9.5 8.9 - 10.3 mg/dL   Total Protein 7.3 6.5 - 8.1 g/dL   Albumin 4.4 3.5 - 5.0 g/dL   AST 28 15 - 41 U/L   ALT 19 17 - 63 U/L   Alkaline Phosphatase 84 38 - 126 U/L   Total Bilirubin 0.5 0.3 - 1.2 mg/dL   GFR calc non Af Amer >60 >60 mL/min   GFR calc Af Amer >60 >60 mL/min    Comment: (NOTE) The eGFR has been calculated using the CKD EPI equation. This calculation has not been validated in all clinical situations. eGFR's persistently <60 mL/min signify possible Chronic Kidney Disease.    Anion gap 8 5 - 15  Troponin I     Status: None   Collection Time: 08/03/15 10:37 PM  Result Value Ref Range   Troponin I <0.03 <0.031 ng/mL    Comment:        NO INDICATION OF MYOCARDIAL INJURY.   Blood gas, venous     Status: Abnormal   Collection Time: 08/03/15 11:19 PM  Result Value Ref Range   pH, Ven 7.49 (H) 7.320 - 7.430   pCO2, Ven 38 (L) 44.0 - 60.0 mmHg   pO2, Ven 91.0 (H) 31.0 - 45.0 mmHg   Bicarbonate 29.0 (H) 21.0 - 28.0 mEq/L   Acid-Base Excess 5.4 (H) 0.0 - 3.0 mmol/L   O2 Saturation 97.7 %   Patient temperature 37.0    Collection site VENOUS    Sample type VENOUS   Brain natriuretic peptide     Status: None   Collection Time: 08/03/15 11:19 PM  Result Value Ref Range   B Natriuretic Peptide 57.0 0.0 - 100.0 pg/mL  Urinalysis complete, with microscopic ([facility] only)     Status: Abnormal   Collection Time: 08/03/15 11:49 PM  Result Value Ref Range   Color, Urine STRAW (A) YELLOW   APPearance CLEAR (A) CLEAR   Glucose, UA 50 (A) NEGATIVE mg/dL   Bilirubin Urine NEGATIVE NEGATIVE   Ketones, ur NEGATIVE NEGATIVE mg/dL   Specific Gravity, Urine 1.005 1.005 - 1.030   Hgb urine dipstick 1+ (A) NEGATIVE   pH 8.0 5.0 - 8.0   Protein, ur 30 (A) NEGATIVE mg/dL   Nitrite NEGATIVE NEGATIVE   Leukocytes, UA NEGATIVE NEGATIVE   RBC / HPF 0-5 0 - 5 RBC/hpf   WBC, UA 0-5 0 - 5 WBC/hpf   Bacteria, UA RARE (A) NONE SEEN    Squamous Epithelial / LPF 0-5 (A) NONE SEEN  Dg Chest 2 View  08/03/2015  CLINICAL DATA:  Shortness of breath EXAM: CHEST  2 VIEW COMPARISON:  None. FINDINGS: Hyperinflation of lungs with flattening of the diaphragm consistent with COPD/emphysema. No nodules or masses. The heart, hila, and mediastinum are normal. No other acute abnormalities. IMPRESSION: COPD/emphysema.  No other abnormalities identified. Electronically Signed   By: Dorise Bullion III M.D   On: 08/03/2015 23:17    Review of Systems  Constitutional: Negative for fever and chills.  HENT: Negative for sore throat and tinnitus.   Eyes: Negative for blurred vision and redness.  Respiratory: Positive for cough, sputum production and shortness of breath.   Cardiovascular: Negative for chest pain, palpitations, orthopnea and PND.  Gastrointestinal: Positive for abdominal pain. Negative for nausea, vomiting and diarrhea.  Genitourinary: Negative for dysuria, urgency and frequency.       Urinary hesitancy  Musculoskeletal: Negative for myalgias and joint pain.  Skin: Negative for rash.       No lesions  Neurological: Negative for speech change, focal weakness and weakness.  Endo/Heme/Allergies: Does not bruise/bleed easily.       No temperature intolerance  Psychiatric/Behavioral: Negative for depression and suicidal ideas.    Blood pressure 102/73, pulse 100, temperature 98.3 F (36.8 C), temperature source Oral, resp. rate 19, height _0  (1.803 m), weight 56.7 kg (125 lb), SpO2 97 %. Physical Exam  Nursing note and vitals reviewed. Constitutional: He is oriented to person, place, and time. He appears well-developed. He appears cachectic. No distress. Nasal cannula in place.  HENT:  Head: Normocephalic and atraumatic.  Mouth/Throat: Oropharynx is clear and moist.  Eyes: Conjunctivae are normal. Pupils are equal, round, and reactive to light.  Neck: Normal range of motion. Neck supple. No JVD present. No tracheal deviation  present. No thyromegaly present.  Cardiovascular: Normal rate, regular rhythm and normal heart sounds.  Exam reveals no gallop and no friction rub.   No murmur heard. Respiratory: Breath sounds normal. Tachypnea noted. No respiratory distress. He has no wheezes.  GI: Soft. Bowel sounds are normal. He exhibits no distension. There is no tenderness.  Genitourinary:  Foley catheter in place  Musculoskeletal: Normal range of motion. He exhibits no edema.  Lymphadenopathy:    He has no cervical adenopathy.  Neurological: He is alert and oriented to person, place, and time. No cranial nerve deficit.  Skin: Skin is warm and dry. No erythema.  Psychiatric: He has a normal mood and affect. His behavior is normal. Judgment and thought content normal.     Assessment/Plan This is a 75 year old male admitted for acute on chronic respiratory failure with hypoxia. 1. Acute on chronic respiratory failure with hypoxia: Secondary to COPD exacerbation. The patient she see Solu-Medrol and we will continue oral steroid taper. I place him on azithromycin for anti-inflammatory effect and we will supply supplement oxygen as needed to maintain oxygen saturation 88-92%. Continue inhaled corticosteroid as well as scheduled DuoNeb treatments. 2. Urinary retention: Secondary to BPH. Continue tamsulosin 3. Hyperlipidemia: Continue statin therapy 4. DVT prophylaxis: Lovenox 5. GI prophylaxis: None The patient is a full code. Time spent on admission was inpatient care approximately 45 minutes  Harrie Foreman, MD 08/04/2015, 4:20 AM

## 2015-08-04 NOTE — Discharge Instructions (Signed)
°  DIET:  Regular diet  DISCHARGE CONDITION:  Stable  ACTIVITY:  Activity as tolerated  OXYGEN:  Home Oxygen: Yes.     Oxygen Delivery: 2 liters/min via Patient connected to nasal cannula oxygen  DISCHARGE LOCATION:  home   If you experience worsening of your admission symptoms, develop shortness of breath, life threatening emergency, suicidal or homicidal thoughts you must seek medical attention immediately by calling 911 or calling your MD immediately  if symptoms less severe.  You Must read complete instructions/literature along with all the possible adverse reactions/side effects for all the Medicines you take and that have been prescribed to you. Take any new Medicines after you have completely understood and accpet all the possible adverse reactions/side effects.   Please note  You were cared for by a hospitalist during your hospital stay. If you have any questions about your discharge medications or the care you received while you were in the hospital after you are discharged, you can call the unit and asked to speak with the hospitalist on call if the hospitalist that took care of you is not available. Once you are discharged, your primary care physician will handle any further medical issues. Please note that NO REFILLS for any discharge medications will be authorized once you are discharged, as it is imperative that you return to your primary care physician (or establish a relationship with a primary care physician if you do not have one) for your aftercare needs so that they can reassess your need for medications and monitor your lab values.

## 2015-08-04 NOTE — ED Notes (Signed)
Post void residual measured >9399mL. Bladder was drained. 350mL of urine was drained from bladder. Flow of urine stopped. MD Quale notified of this.

## 2015-08-04 NOTE — Progress Notes (Signed)
Received MD order to discharge patient to home, reviewed discharge instructions home meds, discharge instructions  and prescriptions with patient and family and all verbalized understanding, also reviewed foley care and leg bag care with patient, patient discharged in wheelchair with nursing staff and family to home

## 2015-08-04 NOTE — Care Management (Signed)
Admitted to [facility] with the diagnosis of acute/chronic respiratory failure. Lives with friend, Santina EvansCatherine [phone]). Last seen Dr. Laroy AppleKrebs 3 weeks ago. No home health. No skilled facility. [facility]. Rolling walker and cane available, if needed. Takes care of basic and instrumental activities of daily living himself, drives. Friend will transport. Gwenette GreetBrenda S Angeles Zehner RN MSN CCM Care Management [phone]

## 2015-08-04 NOTE — Care Management Important Message (Signed)
Important Message  Patient Details  Name: Connor Lewis MRN: [id number] Date of Birth: June 24, 1940   Medicare Important Message Given:  Yes    Gwenette GreetBrenda S Tracer Gutridge, RN 08/04/2015, 11:20 AM

## 2015-08-04 NOTE — ED Notes (Signed)
Foley cath inserted. of urine collected in urinal from drainage bag.

## 2015-08-04 NOTE — Addendum Note (Signed)
Addended byLaroy Apple: Russ Looper L on: 08/04/2015 11:26 AM   Modules accepted: SmartSet

## 2015-08-06 NOTE — Discharge Summary (Signed)
[facility] [place] at [facility]   PATIENT NAME: Connor Lewis    MR#:  [id number]  DATE OF BIRTH:  01-09-1941  DATE OF ADMISSION:  08/03/2015 ADMITTING PHYSICIAN: Arnaldo Natal, MD  DATE OF DISCHARGE: 08/04/2015  4:35 PM  PRIMARY CARE PHYSICIAN: Amy L Krebs, NP   ADMISSION DIAGNOSIS:  COPD exacerbation (HCC) [J44.1] Acute urinary retention [R33.8]  DISCHARGE DIAGNOSIS:  Active Problems:   Acute on chronic respiratory failure with hypoxia (HCC)   SECONDARY DIAGNOSIS:   Past Medical History  Diagnosis Date  . Chronic bronchiolitis (HCC)   . Diverticulitis   . Urine incontinence   . COPD (chronic obstructive pulmonary disease) (HCC)      ADMITTING HISTORY  Chief Complaint: Shortness of breath HPI: The patient with past medical history of COPD presents to emergency department complaining of shortness of breath. He states that he has noticed an increasing amount of dyspnea with exertion particularly when he strains to urinate. For the last week he has been taking 2 Flomax tablets per the direction of his primary care doctor to help with his urinary retention. He has gotten some relief from this new regimen but has been progressively more short of breath over this period of time. He admits to cough and increased sputum production. This evening prior to admission he became so short of breath that he had to "turn his oxygen all the way up". He denies chest pain but admits to some abdominal or pelvic fullness. The patient had a urinary catheter placed which drained more than a liter of urine. He also received Solu-Medrol as well as multiple breathing treatments in the emergency department prior to the hospitalist service been contacted for further management.  HOSPITAL COURSE:   * Acute COPD exacerbation with acute on chronic respiratory failure Patient was treated with IV steroids, antibiotics and scheduled nebulizers. He improved well during the hospital  stay. By the time of discharge he is feeling close to normal. On 2 L oxygen which is his baseline. Change to oral prednisone. He does have inhalers and nebulizers at home which he'll continue to use. Follow up with PCP in 1 week.  * Urinary retention with benign prostatic hypertrophy Patient had Foley catheter placed. Flomax discontinued. Patient will follow-up with urology in a week for voiding trial.  Stable for discharge home.  CONSULTS OBTAINED:    DRUG ALLERGIES:   Allergies  Allergen Reactions  . Doxycycline Anaphylaxis and Rash    Given Epi[facility]pen at [facility]  . Sulfa Antibiotics Other (See Comments)    pain    DISCHARGE MEDICATIONS:   Discharge Medication List as of 08/04/2015  2:44 PM    START taking these medications   Details  levofloxacin (LEVAQUIN) 500 MG tablet Take 1 tablet (500 mg total) by mouth daily., Starting 08/04/2015, Until Discontinued, Normal    predniSONE (DELTASONE) 50 MG tablet Take 1 tablet (50 mg total) by mouth daily with breakfast., Starting 08/04/2015, Until Discontinued, Normal      CONTINUE these medications which have NOT CHANGED   Details  albuterol (PROVENTIL HFA;VENTOLIN HFA) 108 (90 Base) MCG/ACT inhaler Inhale 2 puffs into the lungs every 6 (six) hours as needed for wheezing or shortness of breath., Starting 07/06/2015, Until Discontinued, Normal    albuterol (PROVENTIL) (2.5 MG/3ML) 0.083% nebulizer solution Take 3 mLs (2.5 mg total) by nebulization every 6 (six) hours as needed for wheezing or shortness of breath., Starting 06/13/2015, Until Discontinued, Normal    atorvastatin (LIPITOR)  40 MG tablet Take 1 tablet (40 mg total) by mouth daily., Starting 04/13/2015, Until Discontinued, Normal    carbamide peroxide (EAR WAX REMOVAL DROPS) 6.5 % otic solution Place 5 drops into both ears 2 (two) times daily., Starting 05/11/2015, Until Discontinued, Normal    fluticasone (FLONASE) 50 MCG/ACT nasal spray Place 2 sprays into both nostrils daily., Starting  05/11/2015, Until Discontinued, Normal    fluticasone (FLOVENT HFA) 220 MCG/ACT inhaler Inhale 2 puffs into the lungs 2 (two) times daily., Starting 04/07/2015, Until Discontinued, Normal    Ipratropium-Albuterol (COMBIVENT) 20-100 MCG/ACT AERS respimat Inhale 1 puff into the lungs every 6 (six) hours., Starting 04/12/2015, Until Discontinued, Normal    OXYGEN Inhale 2-3 L into the lungs continuous., Until Discontinued, Historical Med    tamsulosin (FLOMAX) 0.4 MG CAPS capsule Take 2 capsules (0.8 mg total) by mouth at bedtime., Starting 07/06/2015, Until Discontinued, Normal    arformoterol (BROVANA) 15 MCG/2ML NEBU Take 2 mLs (15 mcg total) by nebulization 2 (two) times daily., Starting 08/04/2015, Until Discontinued, Print    budesonide (PULMICORT) 0.5 MG/2ML nebulizer solution Take 2 mLs (0.5 mg total) by nebulization 2 (two) times daily., Starting 08/04/2015, Until Discontinued, Print    gabapentin (NEURONTIN) 100 MG capsule Take 1 capsule (100 mg total) by mouth 3 (three) times daily as needed (nerve pain)., Starting 08/04/2015, Until Discontinued, Normal       Today   VITAL SIGNS:  Blood pressure 127/73, pulse 89, temperature 97.5 F (36.4 C), temperature source Oral, resp. rate 18, height  (1.803 m), weight 57.289 kg (126 lb 4.8 oz), SpO2 96 %.  I/O:  No intake or output data in the 24 hours ending 08/06/15 1336  PHYSICAL EXAMINATION:  Physical Exam  GENERAL:  75 y.o.-year-old patient lying in the bed with no acute distress.  LUNGS: Normal breath sounds bilaterally, no wheezing, rales,rhonchi or crepitation. No use of accessory muscles of respiration.  CARDIOVASCULAR: S1, S2 normal. No murmurs, rubs, or gallops.  ABDOMEN: Soft, non-tender, non-distended. Bowel sounds present. No organomegaly or mass.  NEUROLOGIC: Moves all 4 extremities. PSYCHIATRIC: The patient is alert and oriented x 3.  SKIN: No obvious rash, lesion, or ulcer.  Foley catheter in place  DATA REVIEW:    CBC  Recent Labs Lab 08/03/15 2237  WBC 8.2  HGB 14.0  HCT 41.5  PLT 208   Chemistries   Recent Labs Lab 08/03/15 2237  NA 140  K 3.5  CL 101  CO2 31  GLUCOSE 97  BUN 11  CREATININE 1.06  CALCIUM 9.5  AST 28  ALT 19  ALKPHOS 84  BILITOT 0.5    Cardiac Enzymes  Recent Labs Lab 08/03/15 2237  TROPONINI <0.03    Microbiology Results  Results for orders placed or performed in visit on 06/22/15  CULTURE, URINE COMPREHENSIVE     Status: Abnormal   Collection Time: 06/23/15 12:00 AM  Result Value Ref Range Status   Urine Culture, Comprehensive Final report (A)  Final   Result 1 Comment (A)  Final    Comment: Staphylococcus epidermidis 50,000-100,000 colony forming units per mL Based on resistance to penicillin and susceptibility to oxacillin this isolate would be susceptible to: * Penicillinase-stable penicillins; such as:     Cloxacillin     Dicloxacillin     Nafcillin * Beta-lactam/beta-lactamase inhibitor combinations; such as:     Amoxicillin-clavulanic acid     Ampicillin-sulbactam * Antistaphylococcal cephems; such as:     Cefaclor  Cefuroxime * Antistaphylococcal carbapenems; such as:     Imipenem     Meropenem    Result 2 Comment  Final    Comment: Mixed urogenital flora 10,000-25,000 colony forming units per mL    ANTIMICROBIAL SUSCEPTIBILITY Comment  Final    Comment:       ** S = Susceptible; I = Intermediate; R = Resistant **                    P = Positive; N = Negative             MICS are expressed in micrograms per mL    Antibiotic                 RSLT#1    RSLT#2    RSLT#3    RSLT#4 Ciprofloxacin                  R Gentamicin                     S Levofloxacin                   R Linezolid                      S Nitrofurantoin                 S Oxacillin                      S Penicillin                     R Quinupristin/Dalfopristin      S Rifampin                       S Tetracycline                    S Trimethoprim/Sulfa             R Vancomycin                     S     RADIOLOGY:  No results found.  Follow up with PCP in 1 week.  Management plans discussed with the patient, family and they are in agreement.  CODE STATUS:  Code Status History    Date Active Date Inactive Code Status Order ID Comments User Context   08/04/2015  4:00 AM 08/04/2015  7:43 PM Full Code [id number]  Arnaldo NatalMichael S Diamond, MD Inpatient     TOTAL TIME TAKING CARE OF THIS PATIENT ON DAY OF DISCHARGE: more than 30 minutes.   Milagros LollSudini, Elvis Laufer R M.D on 08/06/2015 at 1:36 PM  Between 7am to 6pm - Pager - [phone]  After 6pm go to [email or web address] - password [email or web address]  [facility]  Office  [phone]  CC: Primary care physician; Amy L Krebs, NP  Note: This dictation was prepared with Dragon dictation along with smaller phrase technology. Any transcriptional errors that result from this process are unintentional.

## 2015-08-10 ENCOUNTER — Encounter: Payer: Self-pay | Admitting: Family Medicine

## 2015-08-10 ENCOUNTER — Ambulatory Visit (INDEPENDENT_AMBULATORY_CARE_PROVIDER_SITE_OTHER): Payer: Medicare Other | Admitting: Family Medicine

## 2015-08-10 ENCOUNTER — Telehealth: Payer: Self-pay | Admitting: Family Medicine

## 2015-08-10 VITALS — BP 142/93 | HR 77 | Temp 97.7°F | Resp 18 | Ht 73.0 in | Wt 126.0 lb

## 2015-08-10 DIAGNOSIS — R636 Underweight: Secondary | ICD-10-CM

## 2015-08-10 DIAGNOSIS — R946 Abnormal results of thyroid function studies: Secondary | ICD-10-CM

## 2015-08-10 DIAGNOSIS — J410 Simple chronic bronchitis: Secondary | ICD-10-CM | POA: Diagnosis not present

## 2015-08-10 DIAGNOSIS — G4734 Idiopathic sleep related nonobstructive alveolar hypoventilation: Secondary | ICD-10-CM | POA: Diagnosis not present

## 2015-08-10 DIAGNOSIS — R339 Retention of urine, unspecified: Secondary | ICD-10-CM | POA: Diagnosis not present

## 2015-08-10 DIAGNOSIS — R7989 Other specified abnormal findings of blood chemistry: Secondary | ICD-10-CM

## 2015-08-10 DIAGNOSIS — N401 Enlarged prostate with lower urinary tract symptoms: Secondary | ICD-10-CM

## 2015-08-10 DIAGNOSIS — Z09 Encounter for follow-up examination after completed treatment for conditions other than malignant neoplasm: Secondary | ICD-10-CM | POA: Diagnosis not present

## 2015-08-10 DIAGNOSIS — R351 Nocturia: Secondary | ICD-10-CM | POA: Diagnosis not present

## 2015-08-10 LAB — T4, FREE: Free T4: 1.3 ng/dL (ref 0.8–1.8)

## 2015-08-10 LAB — T3, FREE: T3, Free: 2.3 pg/mL (ref 2.3–4.2)

## 2015-08-10 MED ORDER — NYSTATIN 100000 UNIT/GM EX CREA: 1.0000 "application " | TOPICAL_CREAM | Freq: Two times a day (BID) | CUTANEOUS | Status: DC

## 2015-08-10 MED ORDER — SCANDISHAKE PO MISC: 1.0000 | Freq: Every day | ORAL | Status: AC

## 2015-08-10 MED ORDER — TRIAMCINOLONE ACETONIDE 0.1 % EX CREA: 1.0000 "application " | TOPICAL_CREAM | Freq: Two times a day (BID) | CUTANEOUS | Status: DC

## 2015-08-10 MED ORDER — SCANDISHAKE PO MISC: 1.0000 | Freq: Every day | ORAL | Status: DC

## 2015-08-10 NOTE — Progress Notes (Signed)
Subjective:    Patient ID: Connor Lewis, male    DOB: 03-19-1940, 75 y.o.   MRN: [id number]  HPI: Connor Lewis is a 75 y.o. male presenting on 08/10/2015 for No chief complaint on file.   HPI  Pt presents for hospitalization follow-up for COPD exacerbation and urinary rentention. Was treated for COPD exacerbation. Sent home on prednisone and levaquin. Completed course yesterday. Pt was not taking flovent at home due to cost- did not inform the office. Recently started Brovana and Budesonide nebulized at home. He was previously placed on oral prednisone longterm by his previous PCP for COPD. He felt he breathed better on prednisone and was able to keep weight on.  Pt was found to have urinary rentention in ER. Home with indwelling catheter. Will see urology next week for voiding trial. Having irritation at meatus from where catheter pulls.    Past Medical History  Diagnosis Date  . Chronic bronchiolitis (HCC)   . Diverticulitis   . Urine incontinence   . COPD (chronic obstructive pulmonary disease) (HCC)     Current Outpatient Prescriptions on File Prior to Visit  Medication Sig  . albuterol (PROVENTIL HFA;VENTOLIN HFA) 108 (90 Base) MCG/ACT inhaler Inhale 2 puffs into the lungs every 6 (six) hours as needed for wheezing or shortness of breath.  Marland Kitchen albuterol (PROVENTIL) (2.5 MG/3ML) 0.083% nebulizer solution Take 3 mLs (2.5 mg total) by nebulization every 6 (six) hours as needed for wheezing or shortness of breath.  Marland Kitchen arformoterol (BROVANA) 15 MCG/2ML NEBU Take 2 mLs (15 mcg total) by nebulization 2 (two) times daily.  Marland Kitchen atorvastatin (LIPITOR) 40 MG tablet Take 1 tablet (40 mg total) by mouth daily.  . budesonide (PULMICORT) 0.5 MG/2ML nebulizer solution Take 2 mLs (0.5 mg total) by nebulization 2 (two) times daily.  . carbamide peroxide (EAR WAX REMOVAL DROPS) 6.5 % otic solution Place 5 drops into both ears 2 (two) times daily. (Patient taking differently: Place 5 drops into both ears 2  (two) times daily as needed (ear wax). )  . fluticasone (FLONASE) 50 MCG/ACT nasal spray Place 2 sprays into both nostrils daily.  . fluticasone (FLOVENT HFA) 220 MCG/ACT inhaler Inhale 2 puffs into the lungs 2 (two) times daily.  Marland Kitchen gabapentin (NEURONTIN) 100 MG capsule Take 1 capsule (100 mg total) by mouth 3 (three) times daily as needed (nerve pain).  . Ipratropium-Albuterol (COMBIVENT) 20-100 MCG/ACT AERS respimat Inhale 1 puff into the lungs every 6 (six) hours.  Marland Kitchen levofloxacin (LEVAQUIN) 500 MG tablet Take 1 tablet (500 mg total) by mouth daily.  . OXYGEN Inhale 2-3 L into the lungs continuous.  . predniSONE (DELTASONE) 50 MG tablet Take 1 tablet (50 mg total) by mouth daily with breakfast.  . tamsulosin (FLOMAX) 0.4 MG CAPS capsule Take 2 capsules (0.8 mg total) by mouth at bedtime.   No current facility-administered medications on file prior to visit.    Review of Systems  Constitutional: Negative for fever and chills.  HENT: Negative.   Respiratory: Negative for chest tightness, shortness of breath and wheezing.   Cardiovascular: Negative for chest pain, palpitations and leg swelling.  Gastrointestinal: Negative for nausea, vomiting and abdominal pain.  Endocrine: Negative.   Genitourinary: Negative for dysuria, urgency, discharge, penile pain and testicular pain.       Area of irritation at catheter.   Musculoskeletal: Negative for back pain, joint swelling and arthralgias.  Skin: Negative.   Neurological: Negative for dizziness, weakness, numbness and headaches.  Psychiatric/Behavioral: Negative for  sleep disturbance and dysphoric mood.   Per HPI unless specifically indicated above     Objective:    BP 142/93 mmHg  Pulse 77  Temp(Src) 97.7 F (36.5 C)  Resp 18  Ht 6\' 1"  (1.854 m)  Wt 126 lb (57.153 kg)  BMI 16.63 kg/m2  SpO2 98%  Wt Readings from Last 3 Encounters:  08/10/15 126 lb (57.153 kg)  08/04/15 126 lb 4.8 oz (57.289 kg)  07/06/15 129 lb (58.514 kg)      Physical Exam  Constitutional: He is oriented to person, place, and time. He appears well-developed and well-nourished. No distress.  HENT:  Head: Normocephalic and atraumatic.  Neck: Neck supple. No thyromegaly present.  Cardiovascular: Normal rate, regular rhythm and normal heart sounds.  Exam reveals no gallop and no friction rub.   No murmur heard. Pulmonary/Chest: Effort normal and breath sounds normal. He has no wheezes.  Abdominal: Soft. Bowel sounds are normal. He exhibits no distension. There is no tenderness. There is no rebound.  Genitourinary: Penile erythema (at meatus. see note) present.  Indwelling catheter present. Area of urethral irriation at 4 o'clock. Tan drainage around the meatus.   Musculoskeletal: Normal range of motion. He exhibits no edema or tenderness.  Neurological: He is alert and oriented to person, place, and time. He has normal reflexes.  Skin: Skin is warm and dry. No rash noted. No erythema.  Psychiatric: He has a normal mood and affect. His behavior is normal. Thought content normal.   Results for orders placed or performed during the hospital encounter of 08/03/15  CBC  Result Value Ref Range   WBC 8.2 3.8 - 10.6 K/uL   RBC 4.83 4.40 - 5.90 MIL/uL   Hemoglobin 14.0 13.0 - 18.0 g/dL   HCT 16.141.5 09.640.0 - 04.552.0 %   MCV 86.0 80.0 - 100.0 fL   MCH 28.9 26.0 - 34.0 pg   MCHC 33.7 32.0 - 36.0 g/dL   RDW 40.913.4 81.111.5 - 91.414.5 %   Platelets 208 150 - 440 K/uL  Comprehensive metabolic panel  Result Value Ref Range   Sodium 140 135 - 145 mmol/L   Potassium 3.5 3.5 - 5.1 mmol/L   Chloride 101 101 - 111 mmol/L   CO2 31 22 - 32 mmol/L   Glucose, Bld 97 65 - 99 mg/dL   BUN 11 6 - 20 mg/dL   Creatinine, Ser 7.821.06 0.61 - 1.24 mg/dL   Calcium 9.5 8.9 - 95.610.3 mg/dL   Total Protein 7.3 6.5 - 8.1 g/dL   Albumin 4.4 3.5 - 5.0 g/dL   AST 28 15 - 41 U/L   ALT 19 17 - 63 U/L   Alkaline Phosphatase 84 38 - 126 U/L   Total Bilirubin 0.5 0.3 - 1.2 mg/dL   GFR calc non Af  Amer >60 >60 mL/min   GFR calc Af Amer >60 >60 mL/min   Anion gap 8 5 - 15  Troponin I  Result Value Ref Range   Troponin I <0.03 <0.031 ng/mL  Blood gas, venous  Result Value Ref Range   pH, Ven 7.49 (H) 7.320 - 7.430   pCO2, Ven 38 (L) 44.0 - 60.0 mmHg   pO2, Ven 91.0 (H) 31.0 - 45.0 mmHg   Bicarbonate 29.0 (H) 21.0 - 28.0 mEq/L   Acid-Base Excess 5.4 (H) 0.0 - 3.0 mmol/L   O2 Saturation 97.7 %   Patient temperature 37.0    Collection site VENOUS    Sample type VENOUS  Brain natriuretic peptide  Result Value Ref Range   B Natriuretic Peptide 57.0 0.0 - 100.0 pg/mL  Urinalysis complete, with microscopic ([facility] only)  Result Value Ref Range   Color, Urine STRAW (A) YELLOW   APPearance CLEAR (A) CLEAR   Glucose, UA 50 (A) NEGATIVE mg/dL   Bilirubin Urine NEGATIVE NEGATIVE   Ketones, ur NEGATIVE NEGATIVE mg/dL   Specific Gravity, Urine 1.005 1.005 - 1.030   Hgb urine dipstick 1+ (A) NEGATIVE   pH 8.0 5.0 - 8.0   Protein, ur 30 (A) NEGATIVE mg/dL   Nitrite NEGATIVE NEGATIVE   Leukocytes, UA NEGATIVE NEGATIVE   RBC / HPF 0-5 0 - 5 RBC/hpf   WBC, UA 0-5 0 - 5 WBC/hpf   Bacteria, UA RARE (A) NONE SEEN   Squamous Epithelial / LPF 0-5 (A) NONE SEEN  TSH  Result Value Ref Range   TSH 6.190 (H) 0.350 - 4.500 uIU/mL  Hemoglobin A1c  Result Value Ref Range   Hgb A1c MFr Bld 6.1 (H) 4.0 - 6.0 %      Assessment & Plan:   Problem List Items Addressed This Visit      Respiratory   Chronic bronchitis (HCC)    Trial of Brovana and Budesonide nebs at home. Discussed pulm referral- pt would like to try the nebs first. If breathing is not improved- will see pulmonology. Reviewed the risks of long-term prednisone.  Recheck 4 weeks.       Relevant Orders   Amb ref to Medical Nutrition Therapy-MNT     Genitourinary   BPH associated with nocturia    Currently has catheter- will see urology next week.       Urinary retention    Home catheter- start nystatin and triamcinolone  creams to help with drainage. Pt will follow-up with urology next week.       Relevant Medications   nystatin cream (MYCOSTATIN)   triamcinolone cream (KENALOG) 0.1 %   Other Relevant Orders   CULTURE, URINE COMPREHENSIVE     Other   Hypoxia, sleep related - Primary    Continue 2LNC of O2 at night.        Other Visit Diagnoses    Elevated TSH        Check T4, T3.     Relevant Orders    T4, free    T3, free    Underweight        [facility]. Start scandishakes once daily.     Relevant Medications    Nutritional Supplements (SCANDISHAKE) MISC    Other Relevant Orders    Amb ref to Medical Nutrition Therapy-MNT       Meds ordered this encounter  Medications  . DISCONTD: Nutritional Supplements (SCANDISHAKE) MISC    Sig: Take 1 each by mouth daily.    Dispense:  24 each    Refill:  11    Order Specific Question:  Supervising Provider    Answer:  Janeann Forehand [phone]  . Nutritional Supplements (SCANDISHAKE) MISC    Sig: Take 1 each by mouth daily.    Dispense:  24 each    Refill:  11    Order Specific Question:  Supervising Provider    Answer:  Janeann Forehand [phone]  . nystatin cream (MYCOSTATIN)    Sig: Apply 1 application topically 2 (two) times daily.    Dispense:  30 g    Refill:  0    Order  Specific Question:  Supervising Provider    Answer:  Janeann Forehand [[id number]]  . triamcinolone cream (KENALOG) 0.1 %    Sig: Apply 1 application topically 2 (two) times daily.    Dispense:  30 g    Refill:  0    Order Specific Question:  Supervising Provider    Answer:  Janeann Forehand [[id number]]      Follow up plan: Return in about 4 weeks (around 09/07/2015) for weight check. Marland Kitchen

## 2015-08-10 NOTE — Assessment & Plan Note (Signed)
Continue 2LNC of O2 at night.

## 2015-08-10 NOTE — Assessment & Plan Note (Signed)
Trial of Brovana and Budesonide nebs at home. Discussed pulm referral- pt would like to try the nebs first. If breathing is not improved- will see pulmonology. Reviewed the risks of long-term prednisone.  Recheck 4 weeks.

## 2015-08-10 NOTE — Telephone Encounter (Signed)
Called patient- he wasn't home yet- family will have him call when he returns.  Please inform him that we moved his urology appt to June 14 at 9am. Urology also suggested applying Nystatin and triamcinolone cream to the area around catheter to help with irritation. I sent both creams the pharmacy. Apply twice daily.

## 2015-08-10 NOTE — Telephone Encounter (Signed)
Pt advised.

## 2015-08-10 NOTE — Assessment & Plan Note (Signed)
Home catheter- start nystatin and triamcinolone creams to help with drainage. Pt will follow-up with urology next week.

## 2015-08-10 NOTE — Patient Instructions (Signed)
Try Brovana and Budesonide at home for breathing. Please call in 2 weeks to let me know how your breathing is doing, then we will have you see Lung specialist.   I will cal urology to see if they can see you sooner.   I have you a prescription to Scandishakes to help you gain weight.

## 2015-08-10 NOTE — Assessment & Plan Note (Signed)
Currently has catheter- will see urology next week.

## 2015-08-11 ENCOUNTER — Telehealth: Payer: Self-pay | Admitting: Family Medicine

## 2015-08-11 ENCOUNTER — Inpatient Hospital Stay: Payer: Self-pay | Admitting: Family Medicine

## 2015-08-11 MED ORDER — PREDNISONE 10 MG PO TABS: ORAL_TABLET | ORAL | Status: DC

## 2015-08-11 NOTE — Telephone Encounter (Signed)
Pt is having difficulty breathing.  Neoma LamingCatherine Rudd said something about an inhaler that was to come by mail order but he needs something now or she will have to take him to ER.  Please call [phone]

## 2015-08-11 NOTE — Telephone Encounter (Signed)
Called and spoke with patient. Instructed him to take his Flovent Inhaler BID as directed. Per his visit- he was not taking. He did not sound in respiratory distress via telephone. He was able to hold a conversation- no struggle to breath. No fevers. No chest pain. Just winded with exertion. Will start a prednisone taper today. If his breathing does not improve throughout the day, he needs to go to the ER. Pt verbalized understanding.

## 2015-08-11 NOTE — Telephone Encounter (Signed)
Pt family called he is reporting pink tinged possibly bloody urine. He has a catheter- change in urine color started after he did foley care at home. Unsure if he manipulated the catheter or not.  No flank pain. No fevers. No abdominal pain. No suprapubic pain.  We did a UC yesterday due to some drainage at cath site- it is pending. No urinary symptoms yesterday. Pt advised to drink plenty of fluids. To ER or urgent care over the weekend with flank pain, fever, abdominal pain, with continued bloody urine. Urology appt is scheduled for Wednesday.   Pt reports his breathing is doing a lot better.

## 2015-08-12 LAB — CULTURE, URINE COMPREHENSIVE
Colony Count: NO GROWTH
Organism ID, Bacteria: NO GROWTH

## 2015-08-16 ENCOUNTER — Encounter: Payer: Self-pay | Admitting: Urology

## 2015-08-16 ENCOUNTER — Ambulatory Visit (INDEPENDENT_AMBULATORY_CARE_PROVIDER_SITE_OTHER): Payer: Medicare Other | Admitting: Urology

## 2015-08-16 VITALS — BP 148/82 | HR 96 | Ht 71.0 in | Wt 128.1 lb

## 2015-08-16 DIAGNOSIS — R338 Other retention of urine: Secondary | ICD-10-CM | POA: Diagnosis not present

## 2015-08-16 DIAGNOSIS — N401 Enlarged prostate with lower urinary tract symptoms: Secondary | ICD-10-CM

## 2015-08-16 DIAGNOSIS — N138 Other obstructive and reflux uropathy: Secondary | ICD-10-CM

## 2015-08-16 DIAGNOSIS — N399 Disorder of urinary system, unspecified: Secondary | ICD-10-CM

## 2015-08-16 DIAGNOSIS — N368 Other specified disorders of urethra: Secondary | ICD-10-CM

## 2015-08-16 MED ORDER — FINASTERIDE 5 MG PO TABS: 5.0000 mg | ORAL_TABLET | Freq: Every day | ORAL | Status: DC

## 2015-08-16 NOTE — Progress Notes (Signed)
08/16/2015 1:17 PM   Connor Lewis Oct 27, 1940 [id number]  Referring provider: Loura Pardon, NP [address] [place], [place] [address]  Chief Complaint  Patient presents with  . Urinary Retention    referred by ER  . Torn Meatus    HPI: Patient is a 75 year old Caucasian male who presents today as a referral from his PCP, Connor Rusty Aus, NP for urinary retention and a torn meatus.   He states that he has been having difficulty with urination for several weeks.  He had been initiated on tamsulosin and symptoms did not improve and so it was increased to two capsules daily.  He noticed only a minor improvement.  Then last week, he had a strong urge to urinate and could not void.  He belly started to swell and his blood pressure went up.  A family member called 911 and he was transported to the ED.  He was given a Foley and 1 L of urine was removed from his bladder.  He was admitted for COPD exacerbation and discharged with the catheter.    He was seen by Bjorn Pippin, NP after discharge and was found to have urethral irritation and the catheter some what taunt.  He was prescribed mycolog cream and instructed to leave slack in the catheter to prevent erosion caused by tension.  Today, he is anxious to have his catheter out as he is having several bladder spasms.  He had three during our visit.  His bag had clear yellow urine and he stated he had good output.    His IPSS score is 17/2.        IPSS      08/16/15 0900       International Prostate Symptom Score   How often have you had the sensation of not emptying your bladder? More than half the time     How often have you had to urinate less than every two hours? About half the time     How often have you found you stopped and started again several times when you urinated? Not at All     How often have you found it difficult to postpone urination? About half the time     How often have you had a weak urinary stream? About half the time      How often have you had to strain to start urination? Not at All     How many times did you typically get up at night to urinate? 4 Times     Total IPSS Score 17     Quality of Life due to urinary symptoms   If you were to spend the rest of your life with your urinary condition just the way it is now how would you feel about that? Mostly Satisfied        Score:  1-7 Mild 8-19 Moderate 20-35 Severe  PMH: Past Medical History  Diagnosis Date  . Chronic bronchiolitis (HCC)   . Diverticulitis   . Urine incontinence   . COPD (chronic obstructive pulmonary disease) (HCC)   . Urinary retention   . Anxiety and depression     Surgical History: Past Surgical History  Procedure Laterality Date  . None      Home Medications:    Medication List       This list is accurate as of: 08/16/15 11:59 PM.  Always use your most recent med list.  albuterol (2.5 MG/3ML) 0.083% nebulizer solution  Commonly known as:  PROVENTIL  Take 3 mLs (2.5 mg total) by nebulization every 6 (six) hours as needed for wheezing or shortness of breath.     albuterol 108 (90 Base) MCG/ACT inhaler  Commonly known as:  PROVENTIL HFA;VENTOLIN HFA  Inhale 2 puffs into the lungs every 6 (six) hours as needed for wheezing or shortness of breath.     arformoterol 15 MCG/2ML Nebu  Commonly known as:  BROVANA  Take 2 mLs (15 mcg total) by nebulization 2 (two) times daily.     atorvastatin 40 MG tablet  Commonly known as:  LIPITOR  Take 1 tablet (40 mg total) by mouth daily.     budesonide 0.5 MG/2ML nebulizer solution  Commonly known as:  PULMICORT  Take 2 mLs (0.5 mg total) by nebulization 2 (two) times daily.     carbamide peroxide 6.5 % otic solution  Commonly known as:  EAR WAX REMOVAL DROPS  Place 5 drops into both ears 2 (two) times daily.     finasteride 5 MG tablet  Commonly known as:  PROSCAR  Take 1 tablet (5 mg total) by mouth daily.     fluticasone 220 MCG/ACT inhaler    Commonly known as:  FLOVENT HFA  Inhale 2 puffs into the lungs 2 (two) times daily.     fluticasone 50 MCG/ACT nasal spray  Commonly known as:  FLONASE  Place 2 sprays into both nostrils daily.     gabapentin 100 MG capsule  Commonly known as:  NEURONTIN  Take 1 capsule (100 mg total) by mouth 3 (three) times daily as needed (nerve pain).     Ipratropium-Albuterol 20-100 MCG/ACT Aers respimat  Commonly known as:  COMBIVENT  Inhale 1 puff into the lungs every 6 (six) hours.     levofloxacin 500 MG tablet  Commonly known as:  LEVAQUIN  Take 1 tablet (500 mg total) by mouth daily.     nystatin cream  Commonly known as:  MYCOSTATIN  Apply 1 application topically 2 (two) times daily.     OXYGEN  Inhale 2-3 L into the lungs continuous.     predniSONE 50 MG tablet  Commonly known as:  DELTASONE  Reported on 08/16/2015     predniSONE 10 MG tablet  Commonly known as:  DELTASONE  Take 6 pills on day 1 & 2, 5 pills on day 3 & 4, 4 pills of day 5 & 6, 3 pills on day 7 & 8, 2 pills on day 9 & 10, 1 pill on day 11 & 12.     SCANDISHAKE Misc  Take 1 each by mouth daily.     tamsulosin 0.4 MG Caps capsule  Commonly known as:  FLOMAX  Take 2 capsules (0.8 mg total) by mouth at bedtime.     triamcinolone cream 0.1 %  Commonly known as:  KENALOG  Apply 1 application topically 2 (two) times daily.        Allergies:  Allergies  Allergen Reactions  . Doxycycline Anaphylaxis and Rash    [facility]  . Sulfa Antibiotics Other (See Comments)    pain    Family History: Family History  Problem Relation Age of Onset  . Stroke Mother   . Diabetes Mother   . Heart disease Father   . Kidney disease      mothers side  . Prostate cancer Neg Hx     Social History:  reports that he  has quit smoking. He does not have any smokeless tobacco history on file. He reports that he drinks alcohol. He reports that he does not use illicit drugs.  ROS: UROLOGY Frequent Urination?:  No Hard to postpone urination?: No Burning/pain with urination?: No Get up at night to urinate?: Yes Leakage of urine?: No Urine stream starts and stops?: No Trouble starting stream?: Yes Do you have to strain to urinate?: Yes Blood in urine?: Yes Urinary tract infection?: No Sexually transmitted disease?: No Injury to kidneys or bladder?: No Painful intercourse?: No Weak stream?: No Erection problems?: No Penile pain?: No  Gastrointestinal Nausea?: No Vomiting?: No Indigestion/heartburn?: Yes Diarrhea?: No Constipation?: No  Constitutional Fever: No Night sweats?: No Weight loss?: Yes Fatigue?: Yes  Skin Skin rash/lesions?: No Itching?: No  Eyes Blurred vision?: No Double vision?: No  Ears/Nose/Throat Sore throat?: No Sinus problems?: Yes  Hematologic/Lymphatic Swollen glands?: No Easy bruising?: Yes  Cardiovascular Leg swelling?: Yes Chest pain?: No  Respiratory Cough?: Yes Shortness of breath?: Yes  Endocrine Excessive thirst?: No  Musculoskeletal Back pain?: Yes Joint pain?: No  Neurological Headaches?: No Dizziness?: Yes  Psychologic Depression?: Yes Anxiety?: Yes  Physical Exam: BP 148/82 mmHg  Pulse 96  Ht  (1.803 m)  Wt 128 lb 1.6 oz (58.106 kg)  BMI 17.87 kg/m2  Constitutional: Well nourished. Alert and oriented, No acute distress. HEENT: [place] AT, moist mucus membranes. Trachea midline, no masses. Cardiovascular: No clubbing, cyanosis, or edema. Respiratory: Normal respiratory effort, no increased work of breathing. GI: Abdomen is soft, non tender, non distended, no abdominal masses. Liver and spleen not palpable.  No hernias appreciated.  Stool sample for occult testing is not indicated.   GU: No CVA tenderness.  No bladder fullness or masses.  Patient with uncircumcised phallus. Foreskin easily retracted.  Urethral meatus is patent. It is red with slight mucus.  No erosion.  No penile discharge. No penile lesions or  rashes. Scrotum without lesions, cysts, rashes and/or edema.  Testicles are located scrotally bilaterally. No masses are appreciated in the testicles. Left and right epididymis are normal. Rectal: Patient with  normal sphincter tone. Anus and perineum without scarring or rashes. No rectal masses are appreciated. Prostate is approximately 60 grams, no nodules are appreciated. Seminal vesicles are normal. Skin: No rashes, bruises or suspicious lesions. Lymph: No cervical or inguinal adenopathy. Neurologic: Grossly intact, no focal deficits, moving all 4 extremities. Psychiatric: Normal mood and affect.  Laboratory Data: Lab Results  Component Value Date   WBC 8.2 08/03/2015   HGB 14.0 08/03/2015   HCT 41.5 08/03/2015   MCV 86.0 08/03/2015   PLT 208 08/03/2015    Lab Results  Component Value Date   CREATININE 1.06 08/03/2015     Lab Results  Component Value Date   HGBA1C 6.1* 08/04/2015    Lab Results  Component Value Date   TSH 6.190* 08/03/2015       Component Value Date/Time   CHOL 242* 04/11/2015 1429   HDL 70 04/11/2015 1429   CHOLHDL 3.5 04/11/2015 1429   LDLCALC 138* 04/11/2015 1429    Lab Results  Component Value Date   AST 28 08/03/2015   Lab Results  Component Value Date   ALT 19 08/03/2015   Catheter Removal  Patient is present today for a catheter removal.  10 ml of water was drained from the balloon. A 16 FR foley cath was removed from the bladder no complications were noted . Patient tolerated well.  Assessment & Plan:    1. Acute urinary retention:     - foley catheter removed  -voiding trial today    -return if unable to urinate or experiencing suprapubic discomfort  -follow-up in one month for I PSS score, PVR and exam.  2. BPH with LUTS:     - IPSS score is 17/2    - Continue tamsulosin 0.4 mg daily and add finasteride 5 mg daily  - RTC in 1 months for IPSS and PVR  3. Meatal irritation:   Continue Mycolog cream.  Return in about 1  month (around 09/15/2015) for IPSS and PVR.  These notes generated with voice recognition software. I apologize for typographical errors.  Michiel Cowboy, PA-C  [facility] [address] [place], [place] [address] [phone]

## 2015-08-18 ENCOUNTER — Telehealth: Payer: Self-pay

## 2015-08-18 ENCOUNTER — Ambulatory Visit: Payer: Medicare Other | Admitting: Urology

## 2015-08-18 DIAGNOSIS — N401 Enlarged prostate with lower urinary tract symptoms: Secondary | ICD-10-CM

## 2015-08-18 DIAGNOSIS — N138 Other obstructive and reflux uropathy: Secondary | ICD-10-CM | POA: Insufficient documentation

## 2015-08-18 DIAGNOSIS — R338 Other retention of urine: Secondary | ICD-10-CM | POA: Insufficient documentation

## 2015-08-18 NOTE — Telephone Encounter (Signed)
Pt called complaining of some burning with urination. He had his catheter removed on Wednesday x 2days ago  and every since getting the catheter out he notice a little discomfort when he urinates. He requested that we call him in something to help w/ the discomfort. I informed him to drink plenty of water and he could try AZO an over the counter medication to help with the burning for a few days. I also informed him if his symptoms doesn't improve or gets worse to give us a call back, because we would need to check his urine to rule out infection. He verbalize understanding, no questions or concerns.

## 2015-08-21 ENCOUNTER — Other Ambulatory Visit: Payer: Medicare Other

## 2015-08-21 DIAGNOSIS — R300 Dysuria: Secondary | ICD-10-CM | POA: Diagnosis not present

## 2015-08-21 LAB — URINALYSIS, COMPLETE
Bilirubin, UA: NEGATIVE
Ketones, UA: NEGATIVE
LEUKOCYTES UA: NEGATIVE
Nitrite, UA: POSITIVE — AB
PH UA: 7 (ref 5.0–7.5)
Specific Gravity, UA: 1.015 (ref 1.005–1.030)
UUROB: 1 mg/dL (ref 0.2–1.0)

## 2015-08-21 LAB — MICROSCOPIC EXAMINATION
Bacteria, UA: NONE SEEN
Epithelial Cells (non renal): NONE SEEN /hpf (ref 0–10)

## 2015-08-21 NOTE — Progress Notes (Signed)
While in office pt requested a medication to help with his pain. Per Carollee HerterShannon she does not want to give abx until the ucx results are back. Made pt aware. Pt was not happy with answer and stated "I guess I will just have to suffer until I hear back from you." Reinforced with pt the ucx results will tell which abx are needed and we dont want to just give them out as resistance will build up over time. Pt voiced understanding.

## 2015-08-23 LAB — CULTURE, URINE COMPREHENSIVE

## 2015-08-24 ENCOUNTER — Telehealth: Payer: Self-pay

## 2015-08-24 NOTE — Telephone Encounter (Signed)
Spoke with pt in reference to -ucx. Offered pt an appt to have a PVR. Pt denied an appt for PVR. Reinforced with pt to increase fluid in take. Pt voiced understanding of whole conversation.

## 2015-08-24 NOTE — Telephone Encounter (Signed)
-----   Message from Harle BattiestShannon A McGowan, PA-C sent at 08/24/2015  8:06 AM EDT ----- Patient's urine culture is negative.  He needs to increase his fluids and come in for a PVR to make sure he is not in retention.

## 2015-08-30 ENCOUNTER — Telehealth: Payer: Self-pay | Admitting: Urology

## 2015-08-30 ENCOUNTER — Ambulatory Visit (INDEPENDENT_AMBULATORY_CARE_PROVIDER_SITE_OTHER): Payer: Medicare Other

## 2015-08-30 DIAGNOSIS — R300 Dysuria: Secondary | ICD-10-CM | POA: Diagnosis not present

## 2015-08-30 LAB — BLADDER SCAN AMB NON-IMAGING: Scan Result: 27

## 2015-08-30 MED ORDER — URIBEL 118 MG PO CAPS: 1.0000 | ORAL_CAPSULE | Freq: Four times a day (QID) | ORAL | Status: AC

## 2015-08-30 NOTE — Progress Notes (Signed)
Bladder Scan-27 Patient can void:  Performed By: Rupert Stackshelsea Jackquline Branca, LPN   Per Carollee HerterShannon pt was given coupons for uribel and a script. Instructed pt to drink 8oz of water with each pill and can be taken up to 4x a day. Pt voiced understanding.

## 2015-08-30 NOTE — Telephone Encounter (Signed)
Is patient taking his tamsulosin, finasteride and using the mycolog cream?

## 2015-08-31 NOTE — Telephone Encounter (Signed)
I spoke with the patient.  He is still having dysuria.  His urine culture is negative and his PVR is minimal.  Patient will call in a couple of weeks after taking the Uribel.  If he is still burning, he needs a cystoscopy.

## 2015-08-31 NOTE — Telephone Encounter (Signed)
Spoke with pt in reference to medications. Pt stated that he is taking tamsulosin and finasteride. Pt stated that he was using the mycolog cream up until last week.

## 2015-09-01 ENCOUNTER — Telehealth: Payer: Self-pay

## 2015-09-01 NOTE — Telephone Encounter (Signed)
Nurse attempted to complete PA for uribel. Per insurance company medication will not be covered therefore a PA does not need to be completed.

## 2015-09-04 ENCOUNTER — Telehealth: Payer: Self-pay | Admitting: Family Medicine

## 2015-09-04 MED ORDER — PREDNISONE 20 MG PO TABS: 40.0000 mg | ORAL_TABLET | Freq: Every day | ORAL | Status: DC

## 2015-09-04 NOTE — Telephone Encounter (Signed)
Spoke with patient on phone. Increasing dyspnea. No increase in sputum. Almost went to ER last night. Will treat for short burst of steroids- over holiday. Alarm symptoms reviewed. Return precautions reviewed. Pt has follow-up on 7/11 scheduled.

## 2015-09-04 NOTE — Telephone Encounter (Signed)
Pt has been having difficulty breathing and asked to have a refill on prednisone sent to [facility].  His call back number is [phone]

## 2015-09-04 NOTE — Telephone Encounter (Signed)
Called pt to Triage his symptoms. He was not there- family will have him call back. 1. How long has he had trouble breathing? 2. Is he wheezing? More short of breath than normal? Coughing up more sputum? 3. Is he using Brovana and Budesomide nebulizers daily?  Since we are coming on holiday, we can do short burst of steroids, however, I want to see him in the next week to check on his breathing and I think it is time for him to see a pulmonary specialist.  To ER with continued shortness of breath not relieved by inhalers, chest pain, blood tinged sputum or other concerning symptoms. Thanks! AK

## 2015-09-12 ENCOUNTER — Ambulatory Visit (INDEPENDENT_AMBULATORY_CARE_PROVIDER_SITE_OTHER): Payer: Medicare Other | Admitting: Family Medicine

## 2015-09-12 ENCOUNTER — Encounter: Payer: Self-pay | Admitting: Family Medicine

## 2015-09-12 VITALS — BP 128/93 | HR 83 | Temp 98.6°F | Resp 16 | Ht 71.0 in | Wt 122.6 lb

## 2015-09-12 DIAGNOSIS — J9621 Acute and chronic respiratory failure with hypoxia: Secondary | ICD-10-CM

## 2015-09-12 DIAGNOSIS — J410 Simple chronic bronchitis: Secondary | ICD-10-CM | POA: Diagnosis not present

## 2015-09-12 DIAGNOSIS — E038 Other specified hypothyroidism: Secondary | ICD-10-CM

## 2015-09-12 DIAGNOSIS — R636 Underweight: Secondary | ICD-10-CM | POA: Diagnosis not present

## 2015-09-12 DIAGNOSIS — R7303 Prediabetes: Secondary | ICD-10-CM | POA: Diagnosis not present

## 2015-09-12 DIAGNOSIS — E039 Hypothyroidism, unspecified: Secondary | ICD-10-CM | POA: Insufficient documentation

## 2015-09-12 MED ORDER — LEVOTHYROXINE SODIUM 25 MCG PO TABS: 25.0000 ug | ORAL_TABLET | Freq: Every day | ORAL | Status: DC

## 2015-09-12 NOTE — Assessment & Plan Note (Signed)
Given fatigue, poor sleep, and weight issues- will try lose dose replacement of thyroid hormone. Recheck TSH in 4 weeks.

## 2015-09-12 NOTE — Patient Instructions (Signed)
Please call me if you feel short of breath, coughing up more sputum, or wheezing more. Please use your inhaler when you cough or wheeze.  Go to the ER for severe breathing symptoms.   Please try drink an ensure with each meal. Goal to eat 3 meals per day.   Start taking levothyroxine once daily.

## 2015-09-12 NOTE — Assessment & Plan Note (Addendum)
Recent frequent exacerbations. Pt would like to take long-term prednisone. Have discussed with patient that I feel he should be evaluated by pulmonology to determine if this is the best course of treatment.  Refer to pulmonology for evaluation of symptoms. Pt is taking brovana and budesomide daily with mild improvement with symptoms. Using rescue inhaler once per day.  Reviewed s/s of exacerbation. Pt will call.  Still 2LNC at night.

## 2015-09-12 NOTE — Assessment & Plan Note (Signed)
Pt could not afford nutrition consult. Encouraged ensure or nutrition supplement with each meal. Consider mirtazapine as appetite stimulant.

## 2015-09-12 NOTE — Progress Notes (Signed)
Subjective:    Patient ID: Connor Lewis, male    DOB: 29-Jul-1940, 75 y.o.   MRN: [id number]  HPI: Connor Lewis is a 75 y.o. male presenting on 09/12/2015 for Weight Check   HPI  Pt presents for follow of COPD and weight check. Has lost 4lbs since last visit at this office. Is drinking ensure- once daily. Tried to go to nutrition- but insurance would like pay for it. Is drinking ensure daily- but trouble affording. Is trying to eat 3 meals. Mostly 2 meals. Drinking 1 ensure per day. Has been eating well. Family is concerned about his weight loss. Recently lost a brother lung cancer.  Is using Brovana and Budesomide daily. Started in Mid-June. He feels best on prednisone. Is also using albuterol rescue- 1-3 times per day. Can use a day without it. [place] prednisone for shortness of breath on 7/3-7/8. Breathing is doing much better.  Is still having some dysuria. Improved with Uribel but cannot afford medication. Has follow-up appt with urology on 7/17.  Pt has recently been having migraine HA's. Sleeping in dark room and rest. Took ibuprofen and it has managed the headache.   Past Medical History  Diagnosis Date  . Chronic bronchiolitis (HCC)   . Diverticulitis   . Urine incontinence   . COPD (chronic obstructive pulmonary disease) (HCC)   . Urinary retention   . Anxiety and depression     Current Outpatient Prescriptions on File Prior to Visit  Medication Sig  . albuterol (PROVENTIL HFA;VENTOLIN HFA) 108 (90 Base) MCG/ACT inhaler Inhale 2 puffs into the lungs every 6 (six) hours as needed for wheezing or shortness of breath.  Marland Kitchen. albuterol (PROVENTIL) (2.5 MG/3ML) 0.083% nebulizer solution Take 3 mLs (2.5 mg total) by nebulization every 6 (six) hours as needed for wheezing or shortness of breath.  Marland Kitchen. arformoterol (BROVANA) 15 MCG/2ML NEBU Take 2 mLs (15 mcg total) by nebulization 2 (two) times daily.  Marland Kitchen. atorvastatin (LIPITOR) 40 MG tablet Take 1 tablet (40 mg total) by mouth daily.  .  budesonide (PULMICORT) 0.5 MG/2ML nebulizer solution Take 2 mLs (0.5 mg total) by nebulization 2 (two) times daily.  . carbamide peroxide (EAR WAX REMOVAL DROPS) 6.5 % otic solution Place 5 drops into both ears 2 (two) times daily. (Patient taking differently: Place 5 drops into both ears 2 (two) times daily as needed (ear wax). )  . finasteride (PROSCAR) 5 MG tablet Take 1 tablet (5 mg total) by mouth daily.  . fluticasone (FLONASE) 50 MCG/ACT nasal spray Place 2 sprays into both nostrils daily.  . fluticasone (FLOVENT HFA) 220 MCG/ACT inhaler Inhale 2 puffs into the lungs 2 (two) times daily.  Marland Kitchen. gabapentin (NEURONTIN) 100 MG capsule Take 1 capsule (100 mg total) by mouth 3 (three) times daily as needed (nerve pain).  . Ipratropium-Albuterol (COMBIVENT) 20-100 MCG/ACT AERS respimat Inhale 1 puff into the lungs every 6 (six) hours.  Marland Kitchen. levofloxacin (LEVAQUIN) 500 MG tablet Take 1 tablet (500 mg total) by mouth daily.  . Meth-Hyo-M Bl-Na Phos-Ph Sal (URIBEL) 118 MG CAPS Take 1 capsule (118 mg total) by mouth QID.  Marland Kitchen. Nutritional Supplements (SCANDISHAKE) MISC Take 1 each by mouth daily.  Marland Kitchen. nystatin cream (MYCOSTATIN) Apply 1 application topically 2 (two) times daily.  . OXYGEN Inhale 2-3 L into the lungs continuous.  . predniSONE (DELTASONE) 20 MG tablet Take 2 tablets (40 mg total) by mouth daily with breakfast. For 5 days.  . tamsulosin (FLOMAX) 0.4 MG CAPS capsule Take 2  capsules (0.8 mg total) by mouth at bedtime.  . triamcinolone cream (KENALOG) 0.1 % Apply 1 application topically 2 (two) times daily.   No current facility-administered medications on file prior to visit.    Review of Systems  Constitutional: Positive for fatigue. Negative for fever, chills and unexpected weight change.  Respiratory: Positive for shortness of breath (occasional but back at baseline. ) and wheezing. Negative for choking and chest tightness.   Cardiovascular: Negative for chest pain, palpitations and leg  swelling.  Gastrointestinal: Negative for nausea, vomiting, abdominal pain, abdominal distention and anal bleeding.  Endocrine: Negative.   Genitourinary: Negative for dysuria, urgency, discharge, penile pain and testicular pain.  Musculoskeletal: Negative for back pain, joint swelling and arthralgias.  Skin: Negative.   Neurological: Positive for headaches. Negative for dizziness, weakness and numbness.  Psychiatric/Behavioral: Negative for sleep disturbance and dysphoric mood.   Per HPI unless specifically indicated above     Objective:    BP 128/93 mmHg  Pulse 83  Temp(Src) 98.6 F (37 C) (Oral)  Resp 16  Ht 5\' 11"  (1.803 m)  Wt 122 lb 9.6 oz (55.611 kg)  BMI 17.11 kg/m2  SpO2 95%  Wt Readings from Last 3 Encounters:  09/12/15 122 lb 9.6 oz (55.611 kg)  08/16/15 128 lb 1.6 oz (58.106 kg)  08/10/15 126 lb (57.153 kg)    Physical Exam  Constitutional: He is oriented to person, place, and time. He appears well-developed and well-nourished. No distress.  HENT:  Head: Normocephalic and atraumatic.  Neck: Neck supple. No thyromegaly present.  Cardiovascular: Normal rate, regular rhythm and normal heart sounds.  Exam reveals no gallop and no friction rub.   No murmur heard. Pulmonary/Chest: Effort normal and breath sounds normal. He has no decreased breath sounds. He has no wheezes. He has no rales.  Abdominal: Soft. Bowel sounds are normal. He exhibits no distension. There is no tenderness. There is no rebound.  Musculoskeletal: Normal range of motion. He exhibits no edema or tenderness.  Neurological: He is alert and oriented to person, place, and time. He has normal reflexes.  Skin: Skin is warm and dry. No rash noted. No erythema.  Psychiatric: He has a normal mood and affect. His behavior is normal. Thought content normal.   Results for orders placed or performed in visit on 08/30/15  BLADDER SCAN AMB NON-IMAGING  Result Value Ref Range   Scan Result 27         Assessment & Plan:   Problem List Items Addressed This Visit      Respiratory   Chronic bronchitis (HCC) - Primary    Recent frequent exacerbations. Pt would like to take long-term prednisone. Have discussed with patient that I feel he should be evaluated by pulmonology to determine if this is the best course of treatment.  Refer to pulmonology for evaluation of symptoms. Pt is taking brovana and budesomide daily with mild improvement with symptoms. Using rescue inhaler once per day.  Reviewed s/s of exacerbation. Pt will call.  Still 2LNC at night.        Relevant Orders   Ambulatory referral to Pulmonology   Acute on chronic respiratory failure with hypoxia (HCC)    Continue 2LNC at night during sleep.         Endocrine   Subclinical hypothyroidism    Given fatigue, poor sleep, and weight issues- will try lose dose replacement of thyroid hormone. Recheck TSH in 4 weeks.       Relevant Medications  levothyroxine (LEVOTHROID) 25 MCG tablet     Other   Prediabetes    Last A1c is 6.1% in June 2017.       Patient underweight    Pt could not afford nutrition consult. Encouraged ensure or nutrition supplement with each meal. Consider mirtazapine as appetite stimulant.          Meds ordered this encounter  Medications  . levothyroxine (LEVOTHROID) 25 MCG tablet    Sig: Take 1 tablet (25 mcg total) by mouth daily before breakfast.    Dispense:  30 tablet    Refill:  11    Order Specific Question:  Supervising Provider    Answer:  Janeann Forehand [id number]      Follow up plan: Return in about 4 weeks (around 10/10/2015) for Weight check. Marland Kitchen

## 2015-09-12 NOTE — Assessment & Plan Note (Signed)
Continue 2LNC at night during sleep.

## 2015-09-12 NOTE — Assessment & Plan Note (Signed)
Last A1c is 6.1% in June 2017.

## 2015-09-14 ENCOUNTER — Encounter (INDEPENDENT_AMBULATORY_CARE_PROVIDER_SITE_OTHER): Payer: Self-pay

## 2015-09-14 ENCOUNTER — Telehealth: Payer: Self-pay | Admitting: Internal Medicine

## 2015-09-14 ENCOUNTER — Encounter: Payer: Self-pay | Admitting: Internal Medicine

## 2015-09-14 ENCOUNTER — Ambulatory Visit (INDEPENDENT_AMBULATORY_CARE_PROVIDER_SITE_OTHER): Payer: Medicare Other | Admitting: Internal Medicine

## 2015-09-14 VITALS — BP 150/88 | HR 90 | Ht 71.0 in | Wt 125.8 lb

## 2015-09-14 DIAGNOSIS — J449 Chronic obstructive pulmonary disease, unspecified: Secondary | ICD-10-CM

## 2015-09-14 DIAGNOSIS — G4734 Idiopathic sleep related nonobstructive alveolar hypoventilation: Secondary | ICD-10-CM | POA: Diagnosis not present

## 2015-09-14 DIAGNOSIS — J9621 Acute and chronic respiratory failure with hypoxia: Secondary | ICD-10-CM

## 2015-09-14 DIAGNOSIS — J410 Simple chronic bronchitis: Secondary | ICD-10-CM

## 2015-09-14 DIAGNOSIS — J069 Acute upper respiratory infection, unspecified: Secondary | ICD-10-CM

## 2015-09-14 DIAGNOSIS — R0602 Shortness of breath: Secondary | ICD-10-CM

## 2015-09-14 MED ORDER — ALBUTEROL SULFATE (2.5 MG/3ML) 0.083% IN NEBU: 2.5000 mg | INHALATION_SOLUTION | Freq: Four times a day (QID) | RESPIRATORY_TRACT | Status: DC | PRN

## 2015-09-14 NOTE — Telephone Encounter (Signed)
Spoke with pharmacy and asked them to d/c the rx due to pt getting it thru DME company. Nothing further needed.

## 2015-09-14 NOTE — Progress Notes (Signed)
[facility] Consultation   [facility]: [facility]  MRN# [id number] Connor Lewis Jul 11, 1940  Referring Physician: NP Laroy Apple PMD - NP Laroy Apple Connor Lewis is a 75 y.o. old male seen in consultation for COPD optimization  CC:  Chief Complaint  Patient presents with  . pulmonary consult    pt ref by Dr. Laroy Apple. dx w/copd around 2007. c/o sob w/exertion, dry cough occ prod w/clear mucus & occ wheezing X7y    HPI:  Patient presents today for consultation for COPD optimization. Patient is accompanied by his daughter today. Patient has had a diagnosis of COPD over the last 10 years. He is currently managed with Brovana, Pulmicort, Combivent 3 times a day. He is also on 2 L supplemental oxygen at night, for hypoxia, and 2 L during the day as needed for dyspnea with exertion. His current symptoms include intermittent cough with mild intermittent sputum production, shortness of breath with exertion. He can walk about 20 yards before becoming dyspneic. He is a former smoker, one pack per day 56 years. He quit smoking in 2015. Over the past 6 months he's had at least a COPD exacerbation, one of those leading to a short hospitalization requiring prednisone and antibiotics. For each of his COPD exacerbations, upper respiratory tract infection, he's been given either doxycycline or Levaquin followed by a tapering dose of steroids. Patient states that prednisone significantly improves his breathing and dyspnea. Today he is inquiring about chronic prednisone use given the level of COPD and the amount of exacerbation sees had. He is a former Chiropractor, Research scientist (medical). No exotic animals, no exotic birds. He states about over the past 4-5 years, he's been losing weight slowly, and has had decreased appetite in the last year or so. Aggravating factors for COPD are noxious substances such as perfumes, deodorants, wood-burning, secondhand smoke, heat, and summer.  PMHX:   Past Medical History    Diagnosis Date  . Chronic bronchiolitis (HCC)   . Diverticulitis   . Urine incontinence   . COPD (chronic obstructive pulmonary disease) (HCC)   . Urinary retention   . Anxiety and depression    Surgical Hx:  Past Surgical History  Procedure Laterality Date  . None     Family Hx:  Family History  Problem Relation Age of Onset  . Stroke Mother   . Diabetes Mother   . Heart disease Father   . Kidney disease      mothers side  . Prostate cancer Neg Hx    Social Hx:   Social History  Substance Use Topics  . Smoking status: Former Smoker -- 1.00 packs/day for 56 years  . Smokeless tobacco: None     Comment: quit 2 years  . Alcohol Use: 0.0 oz/week    0 Standard drinks or equivalent per week     Comment: occ   Medication:   Current Outpatient Rx  Name  Route  Sig  Dispense  Refill  . albuterol (PROVENTIL HFA;VENTOLIN HFA) 108 (90 Base) MCG/ACT inhaler   Inhalation   Inhale 2 puffs into the lungs every 6 (six) hours as needed for wheezing or shortness of breath.   1 Inhaler   11   . albuterol (PROVENTIL) (2.5 MG/3ML) 0.083% nebulizer solution   Nebulization   Take 3 mLs (2.5 mg total) by nebulization every 6 (six) hours as needed for wheezing or shortness of breath.   150 mL   1   . arformoterol (BROVANA) 15 MCG/2ML NEBU   Nebulization  Take 2 mLs (15 mcg total) by nebulization 2 (two) times daily.   120 mL   11   . atorvastatin (LIPITOR) 40 MG tablet   Oral   Take 1 tablet (40 mg total) by mouth daily.   90 tablet   3   . budesonide (PULMICORT) 0.5 MG/2ML nebulizer solution   Nebulization   Take 2 mLs (0.5 mg total) by nebulization 2 (two) times daily.   60 mL   12   . carbamide peroxide (EAR WAX REMOVAL DROPS) 6.5 % otic solution   Both Ears   Place 5 drops into both ears 2 (two) times daily. Patient taking differently: Place 5 drops into both ears 2 (two) times daily as needed (ear wax).    15 mL   0   . finasteride (PROSCAR) 5 MG tablet    Oral   Take 1 tablet (5 mg total) by mouth daily.   90 tablet   3   . fluticasone (FLONASE) 50 MCG/ACT nasal spray   Each Nare   Place 2 sprays into both nostrils daily.   16 g   11   . fluticasone (FLOVENT HFA) 220 MCG/ACT inhaler   Inhalation   Inhale 2 puffs into the lungs 2 (two) times daily.   1 Inhaler   12   . gabapentin (NEURONTIN) 100 MG capsule   Oral   Take 1 capsule (100 mg total) by mouth 3 (three) times daily as needed (nerve pain).   90 capsule   11   . Ipratropium-Albuterol (COMBIVENT) 20-100 MCG/ACT AERS respimat   Inhalation   Inhale 1 puff into the lungs every 6 (six) hours.   1 Inhaler   11   . levofloxacin (LEVAQUIN) 500 MG tablet   Oral   Take 1 tablet (500 mg total) by mouth daily.   5 tablet   0   . levothyroxine (LEVOTHROID) 25 MCG tablet   Oral   Take 1 tablet (25 mcg total) by mouth daily before breakfast.   30 tablet   11   . Meth-Hyo-M Bl-Na Phos-Ph Sal (URIBEL) 118 MG CAPS   Oral   Take 1 capsule (118 mg total) by mouth QID.   120 capsule   3   . Nutritional Supplements (SCANDISHAKE) MISC   Oral   Take 1 each by mouth daily.   24 each   11   . nystatin cream (MYCOSTATIN)   Topical   Apply 1 application topically 2 (two) times daily.   30 g   0   . OXYGEN   Inhalation   Inhale 2-3 L into the lungs continuous.         . predniSONE (DELTASONE) 20 MG tablet   Oral   Take 2 tablets (40 mg total) by mouth daily with breakfast. For 5 days.   10 tablet   0   . tamsulosin (FLOMAX) 0.4 MG CAPS capsule   Oral   Take 2 capsules (0.8 mg total) by mouth at bedtime.   60 capsule   11   . triamcinolone cream (KENALOG) 0.1 %   Topical   Apply 1 application topically 2 (two) times daily.   30 g   0       Allergies:  Doxycycline and Sulfa antibiotics  Review of Systems  Constitutional: Positive for weight loss and malaise/fatigue. Negative for fever and chills.  Eyes: Negative for blurred vision and double vision.   Respiratory: Positive for cough, sputum production and shortness of breath.  Negative for hemoptysis and wheezing.   Cardiovascular: Negative for chest pain.  Gastrointestinal: Negative for heartburn, nausea and vomiting.  Genitourinary: Negative for dysuria.  Musculoskeletal: Negative for myalgias.  Neurological: Positive for weakness. Negative for dizziness and headaches.  Endo/Heme/Allergies: Does not bruise/bleed easily.  Psychiatric/Behavioral: Negative for depression.     Physical Examination:   VS: BP 150/88 mmHg  Pulse 90  Ht 5\' 11"  (1.803 m)  Wt 125 lb 12.8 oz (57.063 kg)  BMI 17.55 kg/m2  SpO2 95%  General Appearance: No distress , cachetic appearance.  Neuro:without focal findings, mental status, speech normal, alert and oriented, cranial nerves 2-12 intact, reflexes normal and symmetric, sensation grossly normal  HEENT: PERRLA, EOM intact, no ptosis, no other lesions noticed; Mallampati 3 Pulmonary: normal breath sounds., diaphragmatic excursion normal.No wheezing, No rales;   Sputum Production:  none CardiovascularNormal S1,S2.  No m/r/g.  Abdominal aorta pulsation normal.    Abdomen: Benign, Soft, non-tender, No masses, hepatosplenomegaly, No lymphadenopathy Renal:  No costovertebral tenderness  GU:  No performed at this time. Endoc: No evident thyromegaly, no signs of acromegaly or Cushing features Skin:   warm, no rashes, no ecchymosis  Extremities: normal, no cyanosis, clubbing, no edema, warm with normal capillary refill. Other findings:none   Labs results:   Rad results: (The following images and results were reviewed by Dr. Dema SeverinMungal on 09/14/2015). CXR 08/03/2015 CLINICAL DATA: Shortness of breath  EXAM: CHEST 2 VIEW  COMPARISON: None.  FINDINGS: Hyperinflation of lungs with flattening of the diaphragm consistent with COPD/emphysema. No nodules or masses. The heart, hila, and mediastinum are normal. No other acute  abnormalities.  IMPRESSION: COPD/emphysema. No other abnormalities identified.    Assessment and Plan:74 yo with COPD, recurrent URI, requiring multiple rounds of antibiotics and steroids, seen for optimization of COPD Chronic bronchitis (HCC) Patient with known history of chronic bronchitis, with moderate decline over the last 4-5 years, and severely declining over the last 6 months also. His daughter is present with him at this time. He is noted to have cachectic appearance, with COPD could be adding to, and its chronic inflammatory state. At this time, he is well managed with Brovana, Pulmicort, when necessary DuoNeb's. I suspect he is end-stage COPD, and only responsive to prednisone, as noted in the history. His primary care physician is not inclined to start chronic prednisone and him just yet, which I agree with. At this time we will fall pulmonary function testing, and decide on prednisone therapy afterwards. New or bronchodilators which include anticholinergics and long-acting beta agonists may be of benefit in him before we start chronic prednisone. However, with his cachectic appearance and muscle wasting I doubt that he has the full ability to continue with inhalers versus nebulizers.  Plan: -Continue with Brovana, Pulmicort, as needed DuoNeb -Pulmonary function testing and 6 MWT prior to follow-up visit -CT Chest with contrast prior to follow up given his recurrent flare ups this year.   Acute on chronic respiratory failure with hypoxia (HCC) Severe COPD Cont with O2 at night, 2L, and with exertion.   COPD mixed type (HCC) Bronchitis/Emphysema  Current meds- Brovana, Pulmicort, PRN duonebs, supplemental O2 at night  Plan - cont with above meds - PFTs and 6mwt prior to follow up visit.   Recurrent URI (upper respiratory infection) I suspect that patient's COPD is becoming more severe, given the frequencyas been increase of his  Exacerbations and recur upper  respiratory tract infection. At this time alternative or other therapies would include chronic  macrolide therapy, Daliresp, or chronic prednisone.  Plan: -continue current nebulizer treatments which include Brovana, Pulmicort, And as needed DuoNeb's - Pulmonary function test, CT chest, 6 minute walk test prior to follow-up visit  Hypoxia, sleep related Continue with 2 L oxygen at night    Updated Medication List Outpatient Encounter Prescriptions as of 09/14/2015  Medication Sig  . albuterol (PROVENTIL HFA;VENTOLIN HFA) 108 (90 Base) MCG/ACT inhaler Inhale 2 puffs into the lungs every 6 (six) hours as needed for wheezing or shortness of breath.  Marland Kitchen albuterol (PROVENTIL) (2.5 MG/3ML) 0.083% nebulizer solution Take 3 mLs (2.5 mg total) by nebulization every 6 (six) hours as needed for wheezing or shortness of breath.  Marland Kitchen arformoterol (BROVANA) 15 MCG/2ML NEBU Take 2 mLs (15 mcg total) by nebulization 2 (two) times daily.  Marland Kitchen atorvastatin (LIPITOR) 40 MG tablet Take 1 tablet (40 mg total) by mouth daily.  . budesonide (PULMICORT) 0.5 MG/2ML nebulizer solution Take 2 mLs (0.5 mg total) by nebulization 2 (two) times daily.  . carbamide peroxide (EAR WAX REMOVAL DROPS) 6.5 % otic solution Place 5 drops into both ears 2 (two) times daily. (Patient taking differently: Place 5 drops into both ears 2 (two) times daily as needed (ear wax). )  . finasteride (PROSCAR) 5 MG tablet Take 1 tablet (5 mg total) by mouth daily.  . fluticasone (FLONASE) 50 MCG/ACT nasal spray Place 2 sprays into both nostrils daily.  . fluticasone (FLOVENT HFA) 220 MCG/ACT inhaler Inhale 2 puffs into the lungs 2 (two) times daily.  Marland Kitchen gabapentin (NEURONTIN) 100 MG capsule Take 1 capsule (100 mg total) by mouth 3 (three) times daily as needed (nerve pain).  . Ipratropium-Albuterol (COMBIVENT) 20-100 MCG/ACT AERS respimat Inhale 1 puff into the lungs every 6 (six) hours.  Marland Kitchen levofloxacin (LEVAQUIN) 500 MG tablet Take 1 tablet (500 mg  total) by mouth daily.  Marland Kitchen levothyroxine (LEVOTHROID) 25 MCG tablet Take 1 tablet (25 mcg total) by mouth daily before breakfast.  . Meth-Hyo-M Bl-Na Phos-Ph Sal (URIBEL) 118 MG CAPS Take 1 capsule (118 mg total) by mouth QID.  Marland Kitchen Nutritional Supplements (SCANDISHAKE) MISC Take 1 each by mouth daily.  Marland Kitchen nystatin cream (MYCOSTATIN) Apply 1 application topically 2 (two) times daily.  . OXYGEN Inhale 2-3 L into the lungs continuous.  . predniSONE (DELTASONE) 20 MG tablet Take 2 tablets (40 mg total) by mouth daily with breakfast. For 5 days.  . tamsulosin (FLOMAX) 0.4 MG CAPS capsule Take 2 capsules (0.8 mg total) by mouth at bedtime.  . triamcinolone cream (KENALOG) 0.1 % Apply 1 application topically 2 (two) times daily.   No facility-administered encounter medications on file as of 09/14/2015.    Orders for this visit: No orders of the defined types were placed in this encounter.     Thank  you for the consultation and for allowing [facility] to assist in the care of your patient. Our recommendations are noted above.  Please contact us if we can be of further service.   Stephanie Acre, MD [facility] Office Number: [phone]  Note: This note was prepared with Dragon dictation along with smaller phrase technology. Any transcriptional errors that result from this process are unintentional.

## 2015-09-14 NOTE — Patient Instructions (Signed)
Follow up with Dr. Dema SeverinMungal in: 4-6 weeks - pulmonary function testing and 6 minute walk test prior to follow up - cont with current inhalers and nebs - avoid all forms of smoking and noxious substances - CT Chest with contrast

## 2015-09-14 NOTE — Telephone Encounter (Signed)
Please call pharmacy regarding Albuterol nebulizer regarding the quantity, insurance is requesting different quantitiy.

## 2015-09-15 NOTE — Assessment & Plan Note (Signed)
Continue with 2 L oxygen at night

## 2015-09-15 NOTE — Assessment & Plan Note (Signed)
Severe COPD Cont with O2 at night, 2L, and with exertion.

## 2015-09-15 NOTE — Assessment & Plan Note (Signed)
I suspect that patient's COPD is becoming more severe, given the frequencyas been increase of his  Exacerbations and recur upper respiratory tract infection. At this time alternative or other therapies would include chronic macrolide therapy, Daliresp, or chronic prednisone.  Plan: -continue current nebulizer treatments which include Brovana, Pulmicort, And as needed DuoNeb's - Pulmonary function test, CT chest, 6 minute walk test prior to follow-up visit

## 2015-09-15 NOTE — Assessment & Plan Note (Signed)
Patient with known history of chronic bronchitis, with moderate decline over the last 4-5 years, and severely declining over the last 6 months also. His daughter is present with him at this time. He is noted to have cachectic appearance, with COPD could be adding to, and its chronic inflammatory state. At this time, he is well managed with Brovana, Pulmicort, when necessary DuoNeb's. I suspect he is end-stage COPD, and only responsive to prednisone, as noted in the history. His primary care physician is not inclined to start chronic prednisone and him just yet, which I agree with. At this time we will fall pulmonary function testing, and decide on prednisone therapy afterwards. New or bronchodilators which include anticholinergics and long-acting beta agonists may be of benefit in him before we start chronic prednisone. However, with his cachectic appearance and muscle wasting I doubt that he has the full ability to continue with inhalers versus nebulizers.  Plan: -Continue with Brovana, Pulmicort, as needed DuoNeb -Pulmonary function testing and 6 MWT prior to follow-up visit -CT Chest with contrast prior to follow up given his recurrent flare ups this year.

## 2015-09-15 NOTE — Assessment & Plan Note (Signed)
Bronchitis/Emphysema  Current meds- Brovana, Pulmicort, PRN duonebs, supplemental O2 at night  Plan - cont with above meds - PFTs and prior to follow up visit.

## 2015-09-18 ENCOUNTER — Encounter: Payer: Self-pay | Admitting: Urology

## 2015-09-18 ENCOUNTER — Encounter: Payer: Self-pay | Admitting: Family Medicine

## 2015-09-18 ENCOUNTER — Other Ambulatory Visit: Payer: Self-pay | Admitting: Family Medicine

## 2015-09-18 ENCOUNTER — Ambulatory Visit (INDEPENDENT_AMBULATORY_CARE_PROVIDER_SITE_OTHER): Payer: Medicare Other | Admitting: Urology

## 2015-09-18 VITALS — BP 164/103 | HR 78 | Ht 71.0 in | Wt 123.0 lb

## 2015-09-18 DIAGNOSIS — R300 Dysuria: Secondary | ICD-10-CM

## 2015-09-18 DIAGNOSIS — I10 Essential (primary) hypertension: Secondary | ICD-10-CM

## 2015-09-18 DIAGNOSIS — N401 Enlarged prostate with lower urinary tract symptoms: Secondary | ICD-10-CM | POA: Diagnosis not present

## 2015-09-18 DIAGNOSIS — N138 Other obstructive and reflux uropathy: Secondary | ICD-10-CM

## 2015-09-18 LAB — BLADDER SCAN AMB NON-IMAGING

## 2015-09-18 MED ORDER — LISINOPRIL 5 MG PO TABS: 5.0000 mg | ORAL_TABLET | Freq: Every day | ORAL | Status: DC

## 2015-09-18 NOTE — Progress Notes (Signed)
2:20 PM   Connor Lewis May 02, 1940 [id number]  Referring provider: Loura Pardon, NP [address] [place], [place] [address]  Chief Complaint  Patient presents with  . Benign Prostatic Hypertrophy    78month  . Urinary Retention    HPI: Patient is a 75 year old Caucasian male who presents For a one month follow-up for urinary retention.   Background history  Patient was a referral from his PCP, [name], NP for urinary retention and a torn meatus.  He states that he has been having difficulty with urination for several weeks.  He had been initiated on tamsulosin and symptoms did not improve and so it was increased to two capsules daily.  He noticed only a minor improvement.  Then last week, he had a strong urge to urinate and could not void.  He belly started to swell and his blood pressure went up.  A family member called 911 and he was transported to the ED.  He was given a Foley and 1 L of urine was removed from his bladder.  He was admitted for COPD exacerbation and discharged with the catheter.    Since his Foley catheter was removed on 08/16/2015, patient has been having dysuria.   We did check a urine culture which was negative and follow-up PVR's have demonstrated minimal residuals. He was given samples of Uribel and his dysuria has improved.   His previous IPSS score is 17/2.  Today it is 9/2. His PVR is 177 mL. He continues to take the tamsulosin and finasteride. He is also still taking the Uribel.  He states he is having good urinary output. He is not having gross hematuria. He is not having suprapubic pain. He denies fever, chills, nausea and vomiting.      IPSS      08/16/15 0900 09/18/15 1400     International Prostate Symptom Score   How often have you had the sensation of not emptying your bladder? More than half the time Less than half the time    How often have you had to urinate less than every two hours? About half the time Less than half the time    How  often have you found you stopped and started again several times when you urinated? Not at All Less than 1 in 5 times    How often have you found it difficult to postpone urination? About half the time Less than 1 in 5 times    How often have you had a weak urinary stream? About half the time Less than 1 in 5 times    How often have you had to strain to start urination? Not at All Not at All    How many times did you typically get up at night to urinate? 4 Times 2 Times    Total IPSS Score 17 9    Quality of Life due to urinary symptoms   If you were to spend the rest of your life with your urinary condition just the way it is now how would you feel about that? Mostly Satisfied Mostly Satisfied       Score:  1-7 Mild 8-19 Moderate 20-35 Severe  PMH: Past Medical History  Diagnosis Date  . Chronic bronchiolitis (HCC)   . Diverticulitis   . Urine incontinence   . COPD (chronic obstructive pulmonary disease) (HCC)   . Urinary retention   . Anxiety and depression     Surgical History:  Past Surgical History  Procedure Laterality Date  . None      Home Medications:    Medication List       This list is accurate as of: 09/18/15  2:20 PM.  Always use your most recent med list.               albuterol 108 (90 Base) MCG/ACT inhaler  Commonly known as:  PROVENTIL HFA;VENTOLIN HFA  Inhale 2 puffs into the lungs every 6 (six) hours as needed for wheezing or shortness of breath.     arformoterol 15 MCG/2ML Nebu  Commonly known as:  BROVANA  Take 2 mLs (15 mcg total) by nebulization 2 (two) times daily.     atorvastatin 40 MG tablet  Commonly known as:  LIPITOR  Take 1 tablet (40 mg total) by mouth daily.     budesonide 0.5 MG/2ML nebulizer solution  Commonly known as:  PULMICORT  Take 2 mLs (0.5 mg total) by nebulization 2 (two) times daily.     carbamide peroxide 6.5 % otic solution  Commonly known as:  EAR WAX REMOVAL DROPS  Place 5 drops into both ears 2 (two) times  daily.     finasteride 5 MG tablet  Commonly known as:  PROSCAR  Take 1 tablet (5 mg total) by mouth daily.     fluticasone 220 MCG/ACT inhaler  Commonly known as:  FLOVENT HFA  Inhale 2 puffs into the lungs 2 (two) times daily.     fluticasone 50 MCG/ACT nasal spray  Commonly known as:  FLONASE  Place 2 sprays into both nostrils daily.     gabapentin 100 MG capsule  Commonly known as:  NEURONTIN  Take 1 capsule (100 mg total) by mouth 3 (three) times daily as needed (nerve pain).     Ipratropium-Albuterol 20-100 MCG/ACT Aers respimat  Commonly known as:  COMBIVENT  Inhale 1 puff into the lungs every 6 (six) hours.     levothyroxine 25 MCG tablet  Commonly known as:  LEVOTHROID  Take 1 tablet (25 mcg total) by mouth daily before breakfast.     nystatin cream  Commonly known as:  MYCOSTATIN  Apply 1 application topically 2 (two) times daily.     OXYGEN  Inhale 2-3 L into the lungs continuous.     SCANDISHAKE Misc  Take 1 each by mouth daily.     tamsulosin 0.4 MG Caps capsule  Commonly known as:  FLOMAX  Take 2 capsules (0.8 mg total) by mouth at bedtime.     triamcinolone cream 0.1 %  Commonly known as:  KENALOG  Apply 1 application topically 2 (two) times daily.     URIBEL 118 MG Caps  Take 1 capsule (118 mg total) by mouth QID.        Allergies:  Allergies  Allergen Reactions  . Doxycycline Anaphylaxis and Rash    [facility]  . Sulfa Antibiotics Other (See Comments)    pain    Family History: Family History  Problem Relation Age of Onset  . Stroke Mother   . Diabetes Mother   . Heart disease Father   . Kidney disease      mothers side  . Prostate cancer Neg Hx     Social History:  reports that he has quit smoking. He does not have any smokeless tobacco history on file. He reports that he drinks alcohol. He reports that he does not use illicit drugs.  ROS: UROLOGY Frequent Urination?: No  Hard to postpone urination?:  Yes Burning/pain with urination?: No Get up at night to urinate?: Yes Leakage of urine?: No Urine stream starts and stops?: No Trouble starting stream?: No Do you have to strain to urinate?: No Blood in urine?: No Urinary tract infection?: No Sexually transmitted disease?: No Injury to kidneys or bladder?: No Painful intercourse?: No Weak stream?: No Erection problems?: No Penile pain?: No  Gastrointestinal Nausea?: No Vomiting?: No Indigestion/heartburn?: No Diarrhea?: No Constipation?: No  Constitutional Fever: No Night sweats?: No Weight loss?: No Fatigue?: No  Skin Skin rash/lesions?: No Itching?: No  Eyes Blurred vision?: No Double vision?: No  Ears/Nose/Throat Sore throat?: No Sinus problems?: No  Hematologic/Lymphatic Swollen glands?: No Easy bruising?: No  Cardiovascular Leg swelling?: No Chest pain?: No  Respiratory Cough?: No Shortness of breath?: Yes  Endocrine Excessive thirst?: No  Musculoskeletal Back pain?: No Joint pain?: No  Neurological Headaches?: No Dizziness?: No  Psychologic Depression?: No Anxiety?: No  Physical Exam: BP 164/103 mmHg  Pulse 78  Ht  (1.803 m)  Wt 123 lb (55.792 kg)  BMI 17.16 kg/m2  Constitutional: Well nourished. Alert and oriented, No acute distress. HEENT: [place] AT, moist mucus membranes. Trachea midline, no masses. Cardiovascular: No clubbing, cyanosis, or edema. Respiratory: Normal respiratory effort, no increased work of breathing. Skin: No rashes, bruises or suspicious lesions. Lymph: No cervical or inguinal adenopathy. Neurologic: Grossly intact, no focal deficits, moving all 4 extremities. Psychiatric: Normal mood and affect.  Laboratory Data: Lab Results  Component Value Date   WBC 8.2 08/03/2015   HGB 14.0 08/03/2015   HCT 41.5 08/03/2015   MCV 86.0 08/03/2015   PLT 208 08/03/2015    Lab Results  Component Value Date   CREATININE 1.06 08/03/2015     Lab Results   Component Value Date   HGBA1C 6.1* 08/04/2015    Lab Results  Component Value Date   TSH 6.190* 08/03/2015       Component Value Date/Time   CHOL 242* 04/11/2015 1429   HDL 70 04/11/2015 1429   CHOLHDL 3.5 04/11/2015 1429   LDLCALC 138* 04/11/2015 1429    Lab Results  Component Value Date   AST 28 08/03/2015   Lab Results  Component Value Date   ALT 19 08/03/2015    Assessment & Plan:    1. Acute urinary retention:     - Resolved  2. BPH with LUTS:     - IPSS score is 9/2    - Continue tamsulosin 0.4 mg daily and add finasteride 5 mg daily  - RTC in 3 months for IPSS and PVR  3. Dysuria:   Patient will discontinue the Uribel at this time. He will contact us if the dysuria continues. If the dysuria continues, we will schedule a cystoscopy to rule out CIS.    4. HTN:   He is instructed to contact his primary care physician for further evaluation and management of his hypertension.  Return in about 3 months (around 12/19/2015) for IPSS and PVR.  These notes generated with voice recognition software. I apologize for typographical errors.  Michiel Cowboy, PA-C  [facility] [address] [place], [place] [address] [phone]

## 2015-09-22 ENCOUNTER — Encounter: Payer: Self-pay | Admitting: Family Medicine

## 2015-09-22 NOTE — Telephone Encounter (Signed)
Called responding to message from patient's daughter- in [facility]. Pt is reporting he is feeling bad all week. Not wanting to walk around. Feels a little more short of breath. Coughing up green/gray mucus- this is his baseline.

## 2015-09-27 ENCOUNTER — Emergency Department: Payer: Medicare Other

## 2015-09-27 ENCOUNTER — Telehealth: Payer: Self-pay | Admitting: Family Medicine

## 2015-09-27 ENCOUNTER — Emergency Department
Admission: EM | Admit: 2015-09-27 | Discharge: 2015-09-27 | Disposition: A | Discharge status: Home / Self Care | Payer: Medicare Other | Attending: Emergency Medicine | Admitting: Emergency Medicine

## 2015-09-27 ENCOUNTER — Ambulatory Visit: Payer: Medicare Other | Admitting: Family Medicine

## 2015-09-27 ENCOUNTER — Encounter: Payer: Self-pay | Admitting: Emergency Medicine

## 2015-09-27 DIAGNOSIS — R0600 Dyspnea, unspecified: Secondary | ICD-10-CM | POA: Diagnosis present

## 2015-09-27 DIAGNOSIS — E039 Hypothyroidism, unspecified: Secondary | ICD-10-CM | POA: Diagnosis not present

## 2015-09-27 DIAGNOSIS — Z7951 Long term (current) use of inhaled steroids: Secondary | ICD-10-CM | POA: Insufficient documentation

## 2015-09-27 DIAGNOSIS — J449 Chronic obstructive pulmonary disease, unspecified: Secondary | ICD-10-CM | POA: Diagnosis not present

## 2015-09-27 DIAGNOSIS — Z79899 Other long term (current) drug therapy: Secondary | ICD-10-CM | POA: Diagnosis not present

## 2015-09-27 DIAGNOSIS — J441 Chronic obstructive pulmonary disease with (acute) exacerbation: Secondary | ICD-10-CM

## 2015-09-27 DIAGNOSIS — Z87891 Personal history of nicotine dependence: Secondary | ICD-10-CM | POA: Insufficient documentation

## 2015-09-27 DIAGNOSIS — R0689 Other abnormalities of breathing: Secondary | ICD-10-CM | POA: Diagnosis not present

## 2015-09-27 DIAGNOSIS — R531 Weakness: Secondary | ICD-10-CM | POA: Diagnosis not present

## 2015-09-27 DIAGNOSIS — R0602 Shortness of breath: Secondary | ICD-10-CM | POA: Diagnosis not present

## 2015-09-27 LAB — BASIC METABOLIC PANEL
ANION GAP: 11 (ref 5–15)
BUN: 16 mg/dL (ref 6–20)
CHLORIDE: 98 mmol/L — AB (ref 101–111)
CO2: 27 mmol/L (ref 22–32)
Calcium: 9.5 mg/dL (ref 8.9–10.3)
Creatinine, Ser: 1.2 mg/dL (ref 0.61–1.24)
GFR calc non Af Amer: 58 mL/min — ABNORMAL LOW (ref >60)
Glucose, Bld: 252 mg/dL — ABNORMAL HIGH (ref 65–99)
POTASSIUM: 3.2 mmol/L — AB (ref 3.5–5.1)
Sodium: 136 mmol/L (ref 135–145)

## 2015-09-27 LAB — URINALYSIS COMPLETE WITH MICROSCOPIC (ARMC ONLY)
BACTERIA UA: NONE SEEN
Bilirubin Urine: NEGATIVE
Glucose, UA: 150 mg/dL — AB
Ketones, ur: NEGATIVE mg/dL
LEUKOCYTES UA: NEGATIVE
NITRITE: NEGATIVE
PH: 6 (ref 5.0–8.0)
PROTEIN: 100 mg/dL — AB
Specific Gravity, Urine: 1.014 (ref 1.005–1.030)

## 2015-09-27 LAB — CBC
HEMATOCRIT: 43.6 % (ref 40.0–52.0)
HEMOGLOBIN: 14.7 g/dL (ref 13.0–18.0)
MCH: 28.6 pg (ref 26.0–34.0)
MCHC: 33.7 g/dL (ref 32.0–36.0)
MCV: 84.8 fL (ref 80.0–100.0)
Platelets: 224 10*3/uL (ref 150–440)
RBC: 5.14 MIL/uL (ref 4.40–5.90)
RDW: 13.8 % (ref 11.5–14.5)
WBC: 12.4 10*3/uL — ABNORMAL HIGH (ref 3.8–10.6)

## 2015-09-27 LAB — TROPONIN I: Troponin I: 0.03 ng/mL (ref <0.03)

## 2015-09-27 MED ORDER — METHYLPREDNISOLONE SODIUM SUCC 125 MG IJ SOLR
125.0000 mg | Freq: Once | INTRAMUSCULAR | Status: AC
  Administered 2015-09-27: 125 mg via INTRAVENOUS
  Filled 2015-09-27: qty 2

## 2015-09-27 MED ORDER — PREDNISONE 10 MG PO TABS: 10.0000 mg | ORAL_TABLET | Freq: Every day | ORAL | 0 refills | Status: DC

## 2015-09-27 MED ORDER — IPRATROPIUM-ALBUTEROL 0.5-2.5 (3) MG/3ML IN SOLN
3.0000 mL | Freq: Once | RESPIRATORY_TRACT | Status: AC
  Administered 2015-09-27: 3 mL via RESPIRATORY_TRACT
  Filled 2015-09-27: qty 3

## 2015-09-27 MED ORDER — SODIUM CHLORIDE 0.9 % IV BOLUS (SEPSIS)
1000.0000 mL | Freq: Once | INTRAVENOUS | Status: AC
  Administered 2015-09-27: 1000 mL via INTRAVENOUS

## 2015-09-27 NOTE — Telephone Encounter (Signed)
If Connor Lewis is too weak to be seen and having trouble breathing, he needs to go to the ER to be evaluated. He may have a COPD exacerbation that requires emergency care. Have advised daughter to have him seen in ER.  Discussed goals of care. Will place a palliative care consult to discuss symptoms and goals of care. Will consider hospice with end stage COPD confirmation from Dr. Wallis Bamberg.

## 2015-09-27 NOTE — Telephone Encounter (Signed)
Zannie Cove said pt was too weak to come to appt this afternoon.  He is having trouble breathing and has been having really bad headaches.  She is not sure what would be best for him, maybe home health or Hospice.   He's not really eating and has difficulty doing things at home, like bathing and fixing meals.  Her call back number is [phone]

## 2015-09-27 NOTE — ED Triage Notes (Signed)
Pt c/o SHOB at baseline but has gotten worse last night. PCP sent pt here for evaluation. Pt labored in triage. 2 L [place] at baseline but placed on 3 now for comfort. End stage COPD. Has had intermittent chest tightness.

## 2015-09-27 NOTE — ED Provider Notes (Signed)
[facility] Emergency Department Provider Note  Time seen: 5:03 PM  I have reviewed the triage vital signs and the nursing notes.   HISTORY  Chief Complaint Shortness of Breath    HPI Connor Lewis is a 75 y.o. male with a past medical history of COPD on 2 L nasal cannula 24/7, BPH, who presents the emergency department with shortness of breath and generalized weakness. According to the patient he has chronic COPD, states he is always short of breath, but states for the past several days it is been somewhat increased, but not much per patient. He states he has been feeling weak for the past 2 or 3 weeks, which she describes as generalized fatigue, with decreased appetite and decreased intake. Patient had an appointment today with his primary care doctor, but states he did not feel well enough to go, the primary care doctor insisted to go to the emergency department to have labs and a chest x-ray performed as the patient has developed pneumonia in the past. Patient denies any cough, congestion or fever. Denies any chest pain. Denies abdominal pain, nausea, vomiting, dysuria.  Past Medical History:  Diagnosis Date  . Anxiety and depression   . Chronic bronchiolitis (HCC)   . 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 Diverticulitis   . Urinary retention   . Urine incontinence     Patient Active Problem List   Diagnosis Date Noted  . COPD mixed type (HCC) 09/14/2015  . Recurrent URI (upper respiratory infection) 09/14/2015  . Subclinical hypothyroidism 09/12/2015  . Patient underweight 09/12/2015  . Acute urinary retention 08/18/2015  . 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 Urinary retention 08/10/2015  . Acute on chronic respiratory failure with hypoxia (HCC) 08/04/2015  . Hypoxia, sleep related 07/06/2015  . Allergic rhinitis 05/11/2015  . Prediabetes 05/11/2015  . Chronic bronchitis (HCC) 04/07/2015  .  BPH associated with nocturia 04/07/2015  . Peripheral neuropathy (HCC) 04/07/2015    Past Surgical History:  Procedure Laterality Date  . none      Prior to Admission medications   Medication Sig Start Date End Date Taking? Authorizing Provider  albuterol (PROVENTIL HFA;VENTOLIN HFA) 108 (90 Base) MCG/ACT inhaler Inhale 2 puffs into the lungs every 6 (six) hours as needed for wheezing or shortness of breath. 07/06/15   [name], NP  arformoterol (BROVANA) 15 MCG/2ML NEBU Take 2 mLs (15 mcg total) by nebulization 2 (two) times daily. 08/04/15   [name], NP  atorvastatin (LIPITOR) 40 MG tablet Take 1 tablet (40 mg total) by mouth daily. 04/13/15   Amy Lauren Krebs, NP  budesonide (PULMICORT) 0.5 MG/2ML nebulizer solution Take 2 mLs (0.5 mg total) by nebulization 2 (two) times daily. 08/04/15   [name], NP  carbamide peroxide (EAR WAX REMOVAL DROPS) 6.5 % otic solution Place 5 drops into both ears 2 (two) times daily. Patient taking differently: Place 5 drops into both ears 2 (two) times daily as needed (ear wax).  05/11/15   [name], NP  finasteride (PROSCAR) 5 MG tablet Take 1 tablet (5 mg total) by mouth daily. 08/16/15   Carollee Herter A McGowan, PA-C  fluticasone (FLONASE) 50 MCG/ACT nasal spray Place 2 sprays into both nostrils daily. 05/11/15   Amy Lauren Krebs, NP  fluticasone (FLOVENT HFA) 220 MCG/ACT inhaler Inhale 2 puffs into the lungs 2 (two) times daily. 04/07/15   [name], NP  gabapentin (NEURONTIN)  100 MG capsule Take 1 capsule (100 mg total) by mouth 3 (three) times daily as needed (nerve pain). 08/04/15   Amy Lauren Krebs, NP  Ipratropium-Albuterol (COMBIVENT) 20-100 MCG/ACT AERS respimat Inhale 1 puff into the lungs every 6 (six) hours. 04/12/15   [name], NP  levothyroxine (LEVOTHROID) 25 MCG tablet Take 1 tablet (25 mcg total) by mouth daily before breakfast. 09/12/15   [name], NP  lisinopril (PRINIVIL,ZESTRIL) 5 MG tablet Take 1 tablet (5  mg total) by mouth daily. 09/18/15   [name], NP  Meth-Hyo-M Salley Hews Phos-Ph Sal (URIBEL) 118 MG CAPS Take 1 capsule (118 mg total) by mouth QID. 08/30/15   Harle Battiest, PA-C  Nutritional Supplements (SCANDISHAKE) MISC Take 1 each by mouth daily. 08/10/15   [name], NP  nystatin cream (MYCOSTATIN) Apply 1 application topically 2 (two) times daily. 08/10/15   [name], NP  OXYGEN Inhale 2-3 L into the lungs continuous.    Historical Provider, MD  tamsulosin (FLOMAX) 0.4 MG CAPS capsule Take 2 capsules (0.8 mg total) by mouth at bedtime. 07/06/15   [name], NP  triamcinolone cream (KENALOG) 0.1 % Apply 1 application topically 2 (two) times daily. 08/10/15   [name], NP    Allergies Doxycycline and Sulfa antibiotics  Family History  Problem Relation Age of Onset  . Stroke Mother   . Diabetes Mother   . Heart disease Father   . Kidney disease      mothers side  . Prostate cancer Neg Hx     Social History Social History  Substance Use Topics  . Smoking status: Former Smoker    Packs/day: 1.00    Years: 56.00  . Smokeless tobacco: Not on file     Comment: quit in 2015  . Alcohol use 0.0 oz/week     Comment: occ    Review of Systems Constitutional: Negative for fever.Positive for generalized weakness. Cardiovascular: Negative for chest pain. Respiratory: Positive for shortness of breath, mildly increased over baseline. Gastrointestinal: Negative for abdominal pain Musculoskeletal: Negative for back pain. Neurological: Negative for headaches, focal weakness or numbness. 10-point ROS otherwise negative.  ____________________________________________   PHYSICAL EXAM:  VITAL SIGNS: ED Triage Vitals [09/27/15 1536]  Enc Vitals Group     BP (!) 154/98     Pulse Rate 96     Resp (!) 22     Temp      Temp src      SpO2 96 %     Weight 122 lb (55.3 kg)     Height 5\' 11"  (1.803 m)     Head Circumference      Peak Flow      Pain Score       Pain Loc      Pain Edu?      Excl. in GC?     Constitutional: Alert and oriented. Well appearing and in no distress. Eyes: Normal exam ENT   Head: Normocephalic and atraumatic.   Mouth/Throat: Mucous membranes are moist. Cardiovascular: Normal rate, regular rhythm. No murmur Respiratory: Normal respiratory effort without tachypnea nor retractions. Slight expiratory wheeze bilaterally. Decreased air movement bilaterally. Gastrointestinal: Soft and nontender. No distention.   Musculoskeletal: Nontender with normal range of motion in all extremities. No lower extremity tenderness or edema. Neurologic:  Normal speech and language. No gross focal neurologic deficits are appreciated. Skin:  Skin is warm, dry and intact.  Psychiatric: Mood and affect are normal.  ____________________________________________    EKG  EKG reviewed and interpreted by myself shows sinus tachycardia 103 bpm, narrow QRS, normal axis, normal intervals, nonspecific ST changes without ST elevation.  ____________________________________________    RADIOLOGY  Chest x-ray shows emphysema without acute abnormality.  ____________________________________________   INITIAL IMPRESSION / ASSESSMENT AND PLAN / ED COURSE  Pertinent labs & imaging results that were available during my care of the patient were reviewed by me and considered in my medical decision making (see chart for details).  Patient presents emergency department with somewhat worsening shortness of breath, but states generalized weakness with fatigue and decreased appetite with poor oral intake. Patient's labs are largely within normal limits. Chest x-ray shows no acute change, EKG is reassuring. We will check a urinalysis, IV hydrate, dose Solu-Medrol as well as DuoNeb nebs for mild COPD exacerbation.  Labs overall look well. We will discharge home with a prednisone taper.  ____________________________________________   FINAL CLINICAL  IMPRESSION(S) / ED DIAGNOSES  Difficulty breathing Generalized weakness COPD   Minna Antis, MD 09/27/15 [phone]

## 2015-09-27 NOTE — ED Notes (Signed)
Dr. Derrill Kay notified of troponin 0.03.

## 2015-10-03 ENCOUNTER — Telehealth: Payer: Self-pay | Admitting: Family Medicine

## 2015-10-03 MED ORDER — MIRTAZAPINE 15 MG PO TBDP
15.0000 mg | ORAL_TABLET | Freq: Every day | ORAL | 5 refills | Status: DC
Start: 2015-10-03 — End: 2016-03-25

## 2015-10-03 NOTE — Telephone Encounter (Signed)
Called pt to follow-up on Palliative care consult. He would like to proceed to hospice. Pt report he is feeling well since getting home from the E but still having bad days. Would like to try Remeron to help with sleep and eating. Will start 15mg  at bedtime.   Will plan to refer to hospice today.

## 2015-10-05 ENCOUNTER — Telehealth: Payer: Self-pay | Admitting: Family Medicine

## 2015-10-05 NOTE — Telephone Encounter (Signed)
Okay. Noted- will note on the referral.

## 2015-10-05 NOTE — Telephone Encounter (Signed)
Stanton Kidney from  Hospice called [phone], states that daughter have requested that the Medical director  Manage pt  Medicine  Ernestina Penna

## 2015-10-06 DIAGNOSIS — J9621 Acute and chronic respiratory failure with hypoxia: Secondary | ICD-10-CM | POA: Diagnosis not present

## 2015-10-06 DIAGNOSIS — N401 Enlarged prostate with lower urinary tract symptoms: Secondary | ICD-10-CM | POA: Diagnosis not present

## 2015-10-06 DIAGNOSIS — Z9981 Dependence on supplemental oxygen: Secondary | ICD-10-CM | POA: Diagnosis not present

## 2015-10-06 DIAGNOSIS — J441 Chronic obstructive pulmonary disease with (acute) exacerbation: Secondary | ICD-10-CM | POA: Diagnosis not present

## 2015-10-06 DIAGNOSIS — K5790 Diverticulosis of intestine, part unspecified, without perforation or abscess without bleeding: Secondary | ICD-10-CM | POA: Diagnosis not present

## 2015-10-06 DIAGNOSIS — J439 Emphysema, unspecified: Secondary | ICD-10-CM | POA: Diagnosis not present

## 2015-10-06 DIAGNOSIS — Z87891 Personal history of nicotine dependence: Secondary | ICD-10-CM | POA: Diagnosis not present

## 2015-10-06 DIAGNOSIS — N390 Urinary tract infection, site not specified: Secondary | ICD-10-CM | POA: Diagnosis not present

## 2015-10-06 DIAGNOSIS — E039 Hypothyroidism, unspecified: Secondary | ICD-10-CM | POA: Diagnosis not present

## 2015-10-06 DIAGNOSIS — R339 Retention of urine, unspecified: Secondary | ICD-10-CM | POA: Diagnosis not present

## 2015-10-06 DIAGNOSIS — F419 Anxiety disorder, unspecified: Secondary | ICD-10-CM | POA: Diagnosis not present

## 2015-10-06 DIAGNOSIS — J410 Simple chronic bronchitis: Secondary | ICD-10-CM | POA: Diagnosis not present

## 2015-10-06 DIAGNOSIS — R32 Unspecified urinary incontinence: Secondary | ICD-10-CM | POA: Diagnosis not present

## 2015-10-06 DIAGNOSIS — J069 Acute upper respiratory infection, unspecified: Secondary | ICD-10-CM | POA: Diagnosis not present

## 2015-10-06 DIAGNOSIS — J309 Allergic rhinitis, unspecified: Secondary | ICD-10-CM | POA: Diagnosis not present

## 2015-10-06 DIAGNOSIS — R918 Other nonspecific abnormal finding of lung field: Secondary | ICD-10-CM | POA: Diagnosis not present

## 2015-10-06 DIAGNOSIS — R634 Abnormal weight loss: Secondary | ICD-10-CM | POA: Diagnosis not present

## 2015-10-06 DIAGNOSIS — G4736 Sleep related hypoventilation in conditions classified elsewhere: Secondary | ICD-10-CM | POA: Diagnosis not present

## 2015-10-09 DIAGNOSIS — J069 Acute upper respiratory infection, unspecified: Secondary | ICD-10-CM | POA: Diagnosis not present

## 2015-10-09 DIAGNOSIS — J439 Emphysema, unspecified: Secondary | ICD-10-CM | POA: Diagnosis not present

## 2015-10-09 DIAGNOSIS — G4736 Sleep related hypoventilation in conditions classified elsewhere: Secondary | ICD-10-CM | POA: Diagnosis not present

## 2015-10-09 DIAGNOSIS — J441 Chronic obstructive pulmonary disease with (acute) exacerbation: Secondary | ICD-10-CM | POA: Diagnosis not present

## 2015-10-09 DIAGNOSIS — J410 Simple chronic bronchitis: Secondary | ICD-10-CM | POA: Diagnosis not present

## 2015-10-09 DIAGNOSIS — J9621 Acute and chronic respiratory failure with hypoxia: Secondary | ICD-10-CM | POA: Diagnosis not present

## 2015-10-10 DIAGNOSIS — J069 Acute upper respiratory infection, unspecified: Secondary | ICD-10-CM | POA: Diagnosis not present

## 2015-10-10 DIAGNOSIS — G4736 Sleep related hypoventilation in conditions classified elsewhere: Secondary | ICD-10-CM | POA: Diagnosis not present

## 2015-10-10 DIAGNOSIS — J9621 Acute and chronic respiratory failure with hypoxia: Secondary | ICD-10-CM | POA: Diagnosis not present

## 2015-10-10 DIAGNOSIS — J439 Emphysema, unspecified: Secondary | ICD-10-CM | POA: Diagnosis not present

## 2015-10-10 DIAGNOSIS — J441 Chronic obstructive pulmonary disease with (acute) exacerbation: Secondary | ICD-10-CM | POA: Diagnosis not present

## 2015-10-10 DIAGNOSIS — J410 Simple chronic bronchitis: Secondary | ICD-10-CM | POA: Diagnosis not present

## 2015-10-13 DIAGNOSIS — J410 Simple chronic bronchitis: Secondary | ICD-10-CM | POA: Diagnosis not present

## 2015-10-13 DIAGNOSIS — J9621 Acute and chronic respiratory failure with hypoxia: Secondary | ICD-10-CM | POA: Diagnosis not present

## 2015-10-13 DIAGNOSIS — J069 Acute upper respiratory infection, unspecified: Secondary | ICD-10-CM | POA: Diagnosis not present

## 2015-10-13 DIAGNOSIS — J439 Emphysema, unspecified: Secondary | ICD-10-CM | POA: Diagnosis not present

## 2015-10-13 DIAGNOSIS — J441 Chronic obstructive pulmonary disease with (acute) exacerbation: Secondary | ICD-10-CM | POA: Diagnosis not present

## 2015-10-13 DIAGNOSIS — G4736 Sleep related hypoventilation in conditions classified elsewhere: Secondary | ICD-10-CM | POA: Diagnosis not present

## 2015-10-14 DIAGNOSIS — J439 Emphysema, unspecified: Secondary | ICD-10-CM | POA: Diagnosis not present

## 2015-10-14 DIAGNOSIS — J069 Acute upper respiratory infection, unspecified: Secondary | ICD-10-CM | POA: Diagnosis not present

## 2015-10-14 DIAGNOSIS — G4736 Sleep related hypoventilation in conditions classified elsewhere: Secondary | ICD-10-CM | POA: Diagnosis not present

## 2015-10-14 DIAGNOSIS — J410 Simple chronic bronchitis: Secondary | ICD-10-CM | POA: Diagnosis not present

## 2015-10-14 DIAGNOSIS — J9621 Acute and chronic respiratory failure with hypoxia: Secondary | ICD-10-CM | POA: Diagnosis not present

## 2015-10-14 DIAGNOSIS — J441 Chronic obstructive pulmonary disease with (acute) exacerbation: Secondary | ICD-10-CM | POA: Diagnosis not present

## 2015-10-17 ENCOUNTER — Ambulatory Visit: Payer: Medicare Other | Admitting: Family Medicine

## 2015-10-17 DIAGNOSIS — J439 Emphysema, unspecified: Secondary | ICD-10-CM | POA: Diagnosis not present

## 2015-10-17 DIAGNOSIS — J441 Chronic obstructive pulmonary disease with (acute) exacerbation: Secondary | ICD-10-CM | POA: Diagnosis not present

## 2015-10-17 DIAGNOSIS — J069 Acute upper respiratory infection, unspecified: Secondary | ICD-10-CM | POA: Diagnosis not present

## 2015-10-17 DIAGNOSIS — J410 Simple chronic bronchitis: Secondary | ICD-10-CM | POA: Diagnosis not present

## 2015-10-17 DIAGNOSIS — G4736 Sleep related hypoventilation in conditions classified elsewhere: Secondary | ICD-10-CM | POA: Diagnosis not present

## 2015-10-17 DIAGNOSIS — J9621 Acute and chronic respiratory failure with hypoxia: Secondary | ICD-10-CM | POA: Diagnosis not present

## 2015-10-18 DIAGNOSIS — J9621 Acute and chronic respiratory failure with hypoxia: Secondary | ICD-10-CM | POA: Diagnosis not present

## 2015-10-18 DIAGNOSIS — G4736 Sleep related hypoventilation in conditions classified elsewhere: Secondary | ICD-10-CM | POA: Diagnosis not present

## 2015-10-18 DIAGNOSIS — J439 Emphysema, unspecified: Secondary | ICD-10-CM | POA: Diagnosis not present

## 2015-10-18 DIAGNOSIS — J441 Chronic obstructive pulmonary disease with (acute) exacerbation: Secondary | ICD-10-CM | POA: Diagnosis not present

## 2015-10-18 DIAGNOSIS — J410 Simple chronic bronchitis: Secondary | ICD-10-CM | POA: Diagnosis not present

## 2015-10-18 DIAGNOSIS — J069 Acute upper respiratory infection, unspecified: Secondary | ICD-10-CM | POA: Diagnosis not present

## 2015-10-19 ENCOUNTER — Ambulatory Visit: Payer: Medicare Other

## 2015-10-19 DIAGNOSIS — J439 Emphysema, unspecified: Secondary | ICD-10-CM | POA: Diagnosis not present

## 2015-10-19 DIAGNOSIS — J441 Chronic obstructive pulmonary disease with (acute) exacerbation: Secondary | ICD-10-CM | POA: Diagnosis not present

## 2015-10-19 DIAGNOSIS — J410 Simple chronic bronchitis: Secondary | ICD-10-CM | POA: Diagnosis not present

## 2015-10-19 DIAGNOSIS — J069 Acute upper respiratory infection, unspecified: Secondary | ICD-10-CM | POA: Diagnosis not present

## 2015-10-19 DIAGNOSIS — J9621 Acute and chronic respiratory failure with hypoxia: Secondary | ICD-10-CM | POA: Diagnosis not present

## 2015-10-19 DIAGNOSIS — G4736 Sleep related hypoventilation in conditions classified elsewhere: Secondary | ICD-10-CM | POA: Diagnosis not present

## 2015-10-20 DIAGNOSIS — G4736 Sleep related hypoventilation in conditions classified elsewhere: Secondary | ICD-10-CM | POA: Diagnosis not present

## 2015-10-20 DIAGNOSIS — J439 Emphysema, unspecified: Secondary | ICD-10-CM | POA: Diagnosis not present

## 2015-10-20 DIAGNOSIS — J069 Acute upper respiratory infection, unspecified: Secondary | ICD-10-CM | POA: Diagnosis not present

## 2015-10-20 DIAGNOSIS — J9621 Acute and chronic respiratory failure with hypoxia: Secondary | ICD-10-CM | POA: Diagnosis not present

## 2015-10-20 DIAGNOSIS — J410 Simple chronic bronchitis: Secondary | ICD-10-CM | POA: Diagnosis not present

## 2015-10-20 DIAGNOSIS — J441 Chronic obstructive pulmonary disease with (acute) exacerbation: Secondary | ICD-10-CM | POA: Diagnosis not present

## 2015-10-21 DIAGNOSIS — J410 Simple chronic bronchitis: Secondary | ICD-10-CM | POA: Diagnosis not present

## 2015-10-21 DIAGNOSIS — J9621 Acute and chronic respiratory failure with hypoxia: Secondary | ICD-10-CM | POA: Diagnosis not present

## 2015-10-21 DIAGNOSIS — J441 Chronic obstructive pulmonary disease with (acute) exacerbation: Secondary | ICD-10-CM | POA: Diagnosis not present

## 2015-10-21 DIAGNOSIS — J069 Acute upper respiratory infection, unspecified: Secondary | ICD-10-CM | POA: Diagnosis not present

## 2015-10-21 DIAGNOSIS — G4736 Sleep related hypoventilation in conditions classified elsewhere: Secondary | ICD-10-CM | POA: Diagnosis not present

## 2015-10-21 DIAGNOSIS — J439 Emphysema, unspecified: Secondary | ICD-10-CM | POA: Diagnosis not present

## 2015-10-23 DIAGNOSIS — J410 Simple chronic bronchitis: Secondary | ICD-10-CM | POA: Diagnosis not present

## 2015-10-23 DIAGNOSIS — J069 Acute upper respiratory infection, unspecified: Secondary | ICD-10-CM | POA: Diagnosis not present

## 2015-10-23 DIAGNOSIS — J441 Chronic obstructive pulmonary disease with (acute) exacerbation: Secondary | ICD-10-CM | POA: Diagnosis not present

## 2015-10-23 DIAGNOSIS — J439 Emphysema, unspecified: Secondary | ICD-10-CM | POA: Diagnosis not present

## 2015-10-23 DIAGNOSIS — G4736 Sleep related hypoventilation in conditions classified elsewhere: Secondary | ICD-10-CM | POA: Diagnosis not present

## 2015-10-23 DIAGNOSIS — J9621 Acute and chronic respiratory failure with hypoxia: Secondary | ICD-10-CM | POA: Diagnosis not present

## 2015-10-24 ENCOUNTER — Ambulatory Visit: Admission: RE | Admit: 2015-10-24 | Payer: Medicare Other | Source: Ambulatory Visit

## 2015-10-24 DIAGNOSIS — G4736 Sleep related hypoventilation in conditions classified elsewhere: Secondary | ICD-10-CM | POA: Diagnosis not present

## 2015-10-24 DIAGNOSIS — J9621 Acute and chronic respiratory failure with hypoxia: Secondary | ICD-10-CM | POA: Diagnosis not present

## 2015-10-24 DIAGNOSIS — J410 Simple chronic bronchitis: Secondary | ICD-10-CM | POA: Diagnosis not present

## 2015-10-24 DIAGNOSIS — J439 Emphysema, unspecified: Secondary | ICD-10-CM | POA: Diagnosis not present

## 2015-10-24 DIAGNOSIS — J069 Acute upper respiratory infection, unspecified: Secondary | ICD-10-CM | POA: Diagnosis not present

## 2015-10-24 DIAGNOSIS — J441 Chronic obstructive pulmonary disease with (acute) exacerbation: Secondary | ICD-10-CM | POA: Diagnosis not present

## 2015-10-26 DIAGNOSIS — J410 Simple chronic bronchitis: Secondary | ICD-10-CM | POA: Diagnosis not present

## 2015-10-26 DIAGNOSIS — J441 Chronic obstructive pulmonary disease with (acute) exacerbation: Secondary | ICD-10-CM | POA: Diagnosis not present

## 2015-10-26 DIAGNOSIS — G4736 Sleep related hypoventilation in conditions classified elsewhere: Secondary | ICD-10-CM | POA: Diagnosis not present

## 2015-10-26 DIAGNOSIS — J9621 Acute and chronic respiratory failure with hypoxia: Secondary | ICD-10-CM | POA: Diagnosis not present

## 2015-10-26 DIAGNOSIS — J069 Acute upper respiratory infection, unspecified: Secondary | ICD-10-CM | POA: Diagnosis not present

## 2015-10-26 DIAGNOSIS — J439 Emphysema, unspecified: Secondary | ICD-10-CM | POA: Diagnosis not present

## 2015-10-27 ENCOUNTER — Ambulatory Visit: Payer: Medicare Other | Admitting: Internal Medicine

## 2015-10-27 DIAGNOSIS — J9621 Acute and chronic respiratory failure with hypoxia: Secondary | ICD-10-CM | POA: Diagnosis not present

## 2015-10-27 DIAGNOSIS — J410 Simple chronic bronchitis: Secondary | ICD-10-CM | POA: Diagnosis not present

## 2015-10-27 DIAGNOSIS — J069 Acute upper respiratory infection, unspecified: Secondary | ICD-10-CM | POA: Diagnosis not present

## 2015-10-27 DIAGNOSIS — J439 Emphysema, unspecified: Secondary | ICD-10-CM | POA: Diagnosis not present

## 2015-10-27 DIAGNOSIS — J441 Chronic obstructive pulmonary disease with (acute) exacerbation: Secondary | ICD-10-CM | POA: Diagnosis not present

## 2015-10-27 DIAGNOSIS — G4736 Sleep related hypoventilation in conditions classified elsewhere: Secondary | ICD-10-CM | POA: Diagnosis not present

## 2015-10-30 DIAGNOSIS — J9621 Acute and chronic respiratory failure with hypoxia: Secondary | ICD-10-CM | POA: Diagnosis not present

## 2015-10-30 DIAGNOSIS — J069 Acute upper respiratory infection, unspecified: Secondary | ICD-10-CM | POA: Diagnosis not present

## 2015-10-30 DIAGNOSIS — J439 Emphysema, unspecified: Secondary | ICD-10-CM | POA: Diagnosis not present

## 2015-10-30 DIAGNOSIS — G4736 Sleep related hypoventilation in conditions classified elsewhere: Secondary | ICD-10-CM | POA: Diagnosis not present

## 2015-10-30 DIAGNOSIS — J441 Chronic obstructive pulmonary disease with (acute) exacerbation: Secondary | ICD-10-CM | POA: Diagnosis not present

## 2015-10-30 DIAGNOSIS — J410 Simple chronic bronchitis: Secondary | ICD-10-CM | POA: Diagnosis not present

## 2015-10-31 DIAGNOSIS — J439 Emphysema, unspecified: Secondary | ICD-10-CM | POA: Diagnosis not present

## 2015-10-31 DIAGNOSIS — J9621 Acute and chronic respiratory failure with hypoxia: Secondary | ICD-10-CM | POA: Diagnosis not present

## 2015-10-31 DIAGNOSIS — G4736 Sleep related hypoventilation in conditions classified elsewhere: Secondary | ICD-10-CM | POA: Diagnosis not present

## 2015-10-31 DIAGNOSIS — J441 Chronic obstructive pulmonary disease with (acute) exacerbation: Secondary | ICD-10-CM | POA: Diagnosis not present

## 2015-10-31 DIAGNOSIS — J410 Simple chronic bronchitis: Secondary | ICD-10-CM | POA: Diagnosis not present

## 2015-10-31 DIAGNOSIS — J069 Acute upper respiratory infection, unspecified: Secondary | ICD-10-CM | POA: Diagnosis not present

## 2015-11-03 DIAGNOSIS — F419 Anxiety disorder, unspecified: Secondary | ICD-10-CM | POA: Diagnosis not present

## 2015-11-03 DIAGNOSIS — J439 Emphysema, unspecified: Secondary | ICD-10-CM | POA: Diagnosis not present

## 2015-11-03 DIAGNOSIS — J410 Simple chronic bronchitis: Secondary | ICD-10-CM | POA: Diagnosis not present

## 2015-11-03 DIAGNOSIS — R918 Other nonspecific abnormal finding of lung field: Secondary | ICD-10-CM | POA: Diagnosis not present

## 2015-11-03 DIAGNOSIS — R32 Unspecified urinary incontinence: Secondary | ICD-10-CM | POA: Diagnosis not present

## 2015-11-03 DIAGNOSIS — J309 Allergic rhinitis, unspecified: Secondary | ICD-10-CM | POA: Diagnosis not present

## 2015-11-03 DIAGNOSIS — N390 Urinary tract infection, site not specified: Secondary | ICD-10-CM | POA: Diagnosis not present

## 2015-11-03 DIAGNOSIS — J069 Acute upper respiratory infection, unspecified: Secondary | ICD-10-CM | POA: Diagnosis not present

## 2015-11-03 DIAGNOSIS — K5790 Diverticulosis of intestine, part unspecified, without perforation or abscess without bleeding: Secondary | ICD-10-CM | POA: Diagnosis not present

## 2015-11-03 DIAGNOSIS — R634 Abnormal weight loss: Secondary | ICD-10-CM | POA: Diagnosis not present

## 2015-11-03 DIAGNOSIS — J441 Chronic obstructive pulmonary disease with (acute) exacerbation: Secondary | ICD-10-CM | POA: Diagnosis not present

## 2015-11-03 DIAGNOSIS — R339 Retention of urine, unspecified: Secondary | ICD-10-CM | POA: Diagnosis not present

## 2015-11-03 DIAGNOSIS — Z9981 Dependence on supplemental oxygen: Secondary | ICD-10-CM | POA: Diagnosis not present

## 2015-11-03 DIAGNOSIS — Z87891 Personal history of nicotine dependence: Secondary | ICD-10-CM | POA: Diagnosis not present

## 2015-11-03 DIAGNOSIS — E039 Hypothyroidism, unspecified: Secondary | ICD-10-CM | POA: Diagnosis not present

## 2015-11-03 DIAGNOSIS — G4736 Sleep related hypoventilation in conditions classified elsewhere: Secondary | ICD-10-CM | POA: Diagnosis not present

## 2015-11-03 DIAGNOSIS — N401 Enlarged prostate with lower urinary tract symptoms: Secondary | ICD-10-CM | POA: Diagnosis not present

## 2015-11-03 DIAGNOSIS — J9621 Acute and chronic respiratory failure with hypoxia: Secondary | ICD-10-CM | POA: Diagnosis not present

## 2015-11-13 ENCOUNTER — Other Ambulatory Visit: Payer: Self-pay | Admitting: Family Medicine

## 2015-11-13 MED ORDER — LISINOPRIL 5 MG PO TABS: 5.0000 mg | ORAL_TABLET | Freq: Every day | ORAL | 3 refills | Status: DC

## 2015-12-03 DIAGNOSIS — K5790 Diverticulosis of intestine, part unspecified, without perforation or abscess without bleeding: Secondary | ICD-10-CM | POA: Diagnosis not present

## 2015-12-03 DIAGNOSIS — F419 Anxiety disorder, unspecified: Secondary | ICD-10-CM | POA: Diagnosis not present

## 2015-12-03 DIAGNOSIS — G4736 Sleep related hypoventilation in conditions classified elsewhere: Secondary | ICD-10-CM | POA: Diagnosis not present

## 2015-12-03 DIAGNOSIS — J439 Emphysema, unspecified: Secondary | ICD-10-CM | POA: Diagnosis not present

## 2015-12-03 DIAGNOSIS — Z87891 Personal history of nicotine dependence: Secondary | ICD-10-CM | POA: Diagnosis not present

## 2015-12-03 DIAGNOSIS — Z9981 Dependence on supplemental oxygen: Secondary | ICD-10-CM | POA: Diagnosis not present

## 2015-12-03 DIAGNOSIS — J309 Allergic rhinitis, unspecified: Secondary | ICD-10-CM | POA: Diagnosis not present

## 2015-12-03 DIAGNOSIS — J9621 Acute and chronic respiratory failure with hypoxia: Secondary | ICD-10-CM | POA: Diagnosis not present

## 2015-12-03 DIAGNOSIS — R32 Unspecified urinary incontinence: Secondary | ICD-10-CM | POA: Diagnosis not present

## 2015-12-03 DIAGNOSIS — N401 Enlarged prostate with lower urinary tract symptoms: Secondary | ICD-10-CM | POA: Diagnosis not present

## 2015-12-03 DIAGNOSIS — R339 Retention of urine, unspecified: Secondary | ICD-10-CM | POA: Diagnosis not present

## 2015-12-03 DIAGNOSIS — J441 Chronic obstructive pulmonary disease with (acute) exacerbation: Secondary | ICD-10-CM | POA: Diagnosis not present

## 2015-12-03 DIAGNOSIS — N390 Urinary tract infection, site not specified: Secondary | ICD-10-CM | POA: Diagnosis not present

## 2015-12-03 DIAGNOSIS — R918 Other nonspecific abnormal finding of lung field: Secondary | ICD-10-CM | POA: Diagnosis not present

## 2015-12-03 DIAGNOSIS — R634 Abnormal weight loss: Secondary | ICD-10-CM | POA: Diagnosis not present

## 2015-12-03 DIAGNOSIS — E039 Hypothyroidism, unspecified: Secondary | ICD-10-CM | POA: Diagnosis not present

## 2015-12-18 ENCOUNTER — Ambulatory Visit: Payer: Medicare Other | Admitting: Urology

## 2016-01-03 DIAGNOSIS — Z87891 Personal history of nicotine dependence: Secondary | ICD-10-CM | POA: Diagnosis not present

## 2016-01-03 DIAGNOSIS — K5790 Diverticulosis of intestine, part unspecified, without perforation or abscess without bleeding: Secondary | ICD-10-CM | POA: Diagnosis not present

## 2016-01-03 DIAGNOSIS — J9621 Acute and chronic respiratory failure with hypoxia: Secondary | ICD-10-CM | POA: Diagnosis not present

## 2016-01-03 DIAGNOSIS — R634 Abnormal weight loss: Secondary | ICD-10-CM | POA: Diagnosis not present

## 2016-01-03 DIAGNOSIS — G4736 Sleep related hypoventilation in conditions classified elsewhere: Secondary | ICD-10-CM | POA: Diagnosis not present

## 2016-01-03 DIAGNOSIS — R32 Unspecified urinary incontinence: Secondary | ICD-10-CM | POA: Diagnosis not present

## 2016-01-03 DIAGNOSIS — F419 Anxiety disorder, unspecified: Secondary | ICD-10-CM | POA: Diagnosis not present

## 2016-01-03 DIAGNOSIS — R918 Other nonspecific abnormal finding of lung field: Secondary | ICD-10-CM | POA: Diagnosis not present

## 2016-01-03 DIAGNOSIS — R339 Retention of urine, unspecified: Secondary | ICD-10-CM | POA: Diagnosis not present

## 2016-01-03 DIAGNOSIS — E039 Hypothyroidism, unspecified: Secondary | ICD-10-CM | POA: Diagnosis not present

## 2016-01-03 DIAGNOSIS — J441 Chronic obstructive pulmonary disease with (acute) exacerbation: Secondary | ICD-10-CM | POA: Diagnosis not present

## 2016-01-03 DIAGNOSIS — J309 Allergic rhinitis, unspecified: Secondary | ICD-10-CM | POA: Diagnosis not present

## 2016-01-03 DIAGNOSIS — N390 Urinary tract infection, site not specified: Secondary | ICD-10-CM | POA: Diagnosis not present

## 2016-01-03 DIAGNOSIS — J439 Emphysema, unspecified: Secondary | ICD-10-CM | POA: Diagnosis not present

## 2016-01-03 DIAGNOSIS — Z9981 Dependence on supplemental oxygen: Secondary | ICD-10-CM | POA: Diagnosis not present

## 2016-01-03 DIAGNOSIS — N401 Enlarged prostate with lower urinary tract symptoms: Secondary | ICD-10-CM | POA: Diagnosis not present

## 2016-01-11 ENCOUNTER — Encounter: Payer: Self-pay | Admitting: Family Medicine

## 2016-01-11 ENCOUNTER — Ambulatory Visit (INDEPENDENT_AMBULATORY_CARE_PROVIDER_SITE_OTHER): Payer: Medicare Other | Admitting: Family Medicine

## 2016-01-11 VITALS — BP 96/70 | HR 95 | Temp 98.0°F | Resp 16 | Ht 71.0 in | Wt 128.0 lb

## 2016-01-11 DIAGNOSIS — Z23 Encounter for immunization: Secondary | ICD-10-CM | POA: Diagnosis not present

## 2016-01-11 DIAGNOSIS — G8929 Other chronic pain: Secondary | ICD-10-CM | POA: Diagnosis not present

## 2016-01-11 DIAGNOSIS — M546 Pain in thoracic spine: Secondary | ICD-10-CM | POA: Diagnosis not present

## 2016-01-11 DIAGNOSIS — J9611 Chronic respiratory failure with hypoxia: Secondary | ICD-10-CM

## 2016-01-11 DIAGNOSIS — Z9981 Dependence on supplemental oxygen: Secondary | ICD-10-CM

## 2016-01-11 DIAGNOSIS — J449 Chronic obstructive pulmonary disease, unspecified: Secondary | ICD-10-CM

## 2016-01-11 DIAGNOSIS — Z515 Encounter for palliative care: Secondary | ICD-10-CM | POA: Diagnosis not present

## 2016-01-11 DIAGNOSIS — M549 Dorsalgia, unspecified: Secondary | ICD-10-CM

## 2016-01-11 MED ORDER — BACLOFEN 10 MG PO TABS: 5.0000 mg | ORAL_TABLET | Freq: Three times a day (TID) | ORAL | 1 refills | Status: DC | PRN

## 2016-01-11 NOTE — Patient Instructions (Signed)
Thank you for coming in to clinic today.  1. For your Back Pain - I think that this is due to Muscle Spasms or strain.   Start Baclofen (Lioresal) 10mg  tablets - cut in half for 5mg  at night for muscle relaxant - may make you sedated or sleepy, if tolerated you can take every 8 hours, half or whole tab - May use Tylenol Extra Str 500mg  tabs - may take 1-2 tablets every 6 hours as needed - Recommend to start using heating pad on your lower back 1-2x daily for few weeks  This pain may take weeks to months to fully resolve, but hopefully it will respond to the medicine initially. All back injuries (small or serious) are slow to heal since we use our back muscles every day. Be careful with turning, twisting, lifting, sitting / standing for prolonged periods, and avoid re-injury.  Continue taking Prednisone, this will help back pain as well as your breathing.  Flu shot given today.  Please schedule a follow-up appointment with Dr. Althea CharonKaramalegos in 1 to 3 months as needed for COPD, Back Pain - follow-up if you are discharged from hospice care, please make sure that they send us all of your records.  If you have any other questions or concerns, please feel free to call the clinic or send a message through MyChart. You may also schedule an earlier appointment if necessary.  Connor PilarAlexander Shaquala Broeker, DO [facility]

## 2016-01-11 NOTE — Progress Notes (Signed)
Subjective:    Patient ID: Connor Lewis, male    DOB: [birth year], 75 y.o.   MRN: [id number]  Connor Lewis is a 75 y.o. male presenting on 01/11/2016 for Back Pain   HPI   MID / LOW BACK PAIN, Acute on Chronic - Reports symptoms started about 4-5 months ago with inciting injury when he bent forward to move his recliner chair, he attempted to pull the chair, and he heard and felt a "loud pop" in middle of his lower back, associated with severe pain acutely, it "took him to the floor", they called help (hospice nurse) put a belt around his waist on the spot that hurt, lifted him up, and allowed him to sit in chair with improvement. - Recently he has improved with regards to back pain until past 3 weeks had some gradual worsening back pains, mostly R mid back sharp pains worse with deep breathing and coughing with COPD, mostly resolved lower back pains. - He was treated with hydrocodone 5/325 only as needed, rx from hospice, takes 1 at night for pain and help, also taking half lorazepam 0.5 nightly if needed - Denies any fevers/chills, numbness, tingling, weakness, loss of control bladder/bowel incontinence or retention, unintentional wt loss, night sweats  HOSPICE PATIENT, End-Stage COPD / Chronic Respiratory Failure - Chronic bronchitis type COPD, with moderate decline over past 4-5 years by chart review, with more recent acute decline in 7-10/2015, previuosly followed by Dr Dema Severin Pulmonology, treated with Rosalyn Gess, Pulmicort, PRN Duonebs, he has been treated on chronic prednisone over past several months, initially tapers around 20mg , then down to 5, but recently has been managed on Prednisone 20mg  daily, states he does not breath well when it is at lower dose. - He has supplemental 2L overnight and PRN exertion O2 but only uses PRN - Patient reports he was admitted to hospice care back in 10/2015, following hospitalization. He has improved and states that hospice is considering to discharge him soon  after 3 months. He will follow-up with [place] as planned if discharged.   Social History  Substance Use Topics  . Smoking status: Former Smoker    Packs/day: 1.00    Years: 56.00  . Smokeless tobacco: Former Neurosurgeon     Comment: quit in 2015  . Alcohol use 0.0 oz/week     Comment: occ    Review of Systems Per HPI unless specifically indicated above     Objective:    BP 96/70   Pulse 95   Temp 98 F (36.7 C) (Oral)   Resp 16   Ht 5\' 11"  (1.803 m)   Wt 128 lb (58.1 kg)   SpO2 92%   BMI 17.85 kg/m   Wt Readings from Last 3 Encounters:  01/11/16 128 lb (58.1 kg)  09/27/15 122 lb (55.3 kg)  09/18/15 123 lb (55.8 kg)    Physical Exam  Constitutional: He is oriented to person, place, and time. He appears well-developed and well-nourished. No distress.  Chronically ill but currently well appearing, elderly 46 male appears older than stated age, cachetic, comfortable, cooperative. No portable oxygen. Has cane.  HENT:  Head: Normocephalic and atraumatic.  Mild dry mucus mem  Eyes: Conjunctivae are normal. Pupils are equal, round, and reactive to light.  Cardiovascular: Normal rate, regular rhythm, normal heart sounds and intact distal pulses.   No murmur heard. Pulmonary/Chest: Effort normal. No respiratory distress. He has no wheezes. He has no rales. He exhibits tenderness (right lateral rib cage).  Coarse breath  sounds bilateral lower lungs improve with cough. Diffuse reduced air movement  Musculoskeletal: He exhibits no edema.  Mid / Low Back Inspection: Thin body habitus with muscle wasting. No spinal deformity. Palpation: No tenderness over spinous processes. Mild discomfort over Right upper mid thoracic paraspinal muscles with some hypertonicity ROM: Limited ROM forward flex / back extension with stiffness. Strength: Bilateral hip flex/ext 5/5, knee flex/ext 5/5, ankle dorsiflex/plantarflex 5/5 Neurovascular: intact distal sensation to light touch  Neurological: He is alert  and oriented to person, place, and time.  Skin: Skin is warm and dry. No rash noted. He is not diaphoretic.  Psychiatric: He has a normal mood and affect.  Nursing note and vitals reviewed.   Results for orders placed or performed during the hospital encounter of 09/27/15  Basic metabolic panel  Result Value Ref Range   Sodium 136 135 - 145 mmol/L   Potassium 3.2 (L) 3.5 - 5.1 mmol/L   Chloride 98 (L) 101 - 111 mmol/L   CO2 27 22 - 32 mmol/L   Glucose, Bld 252 (H) 65 - 99 mg/dL   BUN 16 6 - 20 mg/dL   Creatinine, Ser 1.611.20 0.61 - 1.24 mg/dL   Calcium 9.5 8.9 - 09.610.3 mg/dL   GFR calc non Af Amer 58 (L) >60 mL/min   GFR calc Af Amer >60 >60 mL/min   Anion gap 11 5 - 15  CBC  Result Value Ref Range   WBC 12.4 (H) 3.8 - 10.6 K/uL   RBC 5.14 4.40 - 5.90 MIL/uL   Hemoglobin 14.7 13.0 - 18.0 g/dL   HCT 04.543.6 40.940.0 - 81.152.0 %   MCV 84.8 80.0 - 100.0 fL   MCH 28.6 26.0 - 34.0 pg   MCHC 33.7 32.0 - 36.0 g/dL   RDW 91.413.8 78.211.5 - 95.614.5 %   Platelets 224 150 - 440 K/uL  Troponin I  Result Value Ref Range   Troponin I 0.03 (HH) <0.03 ng/mL  Urinalysis complete, with microscopic ([facility] only)  Result Value Ref Range   Color, Urine YELLOW (A) YELLOW   APPearance CLEAR (A) CLEAR   Glucose, UA 150 (A) NEGATIVE mg/dL   Bilirubin Urine NEGATIVE NEGATIVE   Ketones, ur NEGATIVE NEGATIVE mg/dL   Specific Gravity, Urine 1.014 1.005 - 1.030   Hgb urine dipstick 1+ (A) NEGATIVE   pH 6.0 5.0 - 8.0   Protein, ur 100 (A) NEGATIVE mg/dL   Nitrite NEGATIVE NEGATIVE   Leukocytes, UA NEGATIVE NEGATIVE   RBC / HPF 6-30 0 - 5 RBC/hpf   WBC, UA 0-5 0 - 5 WBC/hpf   Bacteria, UA NONE SEEN NONE SEEN   Squamous Epithelial / LPF 0-5 (A) NONE SEEN      Assessment & Plan:   Problem List Items Addressed This Visit    Hospice care patient    Currently hospice care patient, admitted 10/2015 for end stage COPD - Per patient seems to be improving over past 3 months, anticipating hospice discharge - Continues to  receive medications from hospice primary attending including hydrocodone / lorazepam - Will follow-up with hospice to discuss patient progress, determine plans for post-discharge if needed      COPD mixed type (HCC)    Recent mild improved End Stage COPD, currently on hospice, possibly discharged soon, previously followed by Pulmonology - Continue current treatments including nebulizers, supplemental O2 as indicated - If discharged from hospice, will resume care and follow-up with Pulmonology      Chronic right-sided back pain -  Primary    Suspected MSK chest wall etiology with muscle strain from frequent COPD coughing. No significant mechanism of injury, did have lower back pain before with minor lifting injury but since resolved. No sciatica symptoms or associated concerns. - Inadequate conservative therapy  Plan: 1. Continue current hospice meds with hydrocodone, lorazepam 2. Start Baclofen trial muscle relaxant 5-10mg  TID PRN 3. May use Tylenol, heating pad 4. Follow-up      Relevant Medications   HYDROcodone-acetaminophen (NORCO/VICODIN) 5-325 MG tablet   baclofen (LIORESAL) 10 MG tablet    Other Visit Diagnoses    Chronic respiratory failure with hypoxia, on home oxygen therapy (HCC)       Needs flu shot       Relevant Orders   Flu vaccine HIGH DOSE PF (Completed)      Meds ordered this encounter  Medications  .           . baclofen (LIORESAL) 10 MG tablet    Sig: Take 0.5-1 tablets (5-10 mg total) by mouth 3 (three) times daily as needed for muscle spasms.    Dispense:  30 each    Refill:  1      Follow up plan: Return in about 4 weeks (around 02/08/2016), or if symptoms worsen or fail to improve, for COPD, back pain.  Saralyn PilarAlexander Kiree Dejarnette, DO [facility] 01/11/2016, 5:45 PM

## 2016-01-11 NOTE — Assessment & Plan Note (Signed)
Currently hospice care patient, admitted 10/2015 for end stage COPD - Per patient seems to be improving over past 3 months, anticipating hospice discharge - Continues to receive medications from hospice primary attending including hydrocodone / lorazepam - Will follow-up with hospice to discuss patient progress, determine plans for post-discharge if needed

## 2016-01-11 NOTE — Assessment & Plan Note (Signed)
Suspected MSK chest wall etiology with muscle strain from frequent COPD coughing. No significant mechanism of injury, did have lower back pain before with minor lifting injury but since resolved. No sciatica symptoms or associated concerns. - Inadequate conservative therapy  Plan: 1. Continue current hospice meds with hydrocodone, lorazepam 2. Start Baclofen trial muscle relaxant 5-10mg  TID PRN 3. May use Tylenol, heating pad 4. Follow-up

## 2016-01-11 NOTE — Assessment & Plan Note (Signed)
Recent mild improved End Stage COPD, currently on hospice, possibly discharged soon, previously followed by Pulmonology - Continue current treatments including nebulizers, supplemental O2 as indicated - If discharged from hospice, will resume care and follow-up with Pulmonology

## 2016-02-02 DIAGNOSIS — J441 Chronic obstructive pulmonary disease with (acute) exacerbation: Secondary | ICD-10-CM | POA: Diagnosis not present

## 2016-02-02 DIAGNOSIS — J309 Allergic rhinitis, unspecified: Secondary | ICD-10-CM | POA: Diagnosis not present

## 2016-02-02 DIAGNOSIS — N401 Enlarged prostate with lower urinary tract symptoms: Secondary | ICD-10-CM | POA: Diagnosis not present

## 2016-02-02 DIAGNOSIS — Z87891 Personal history of nicotine dependence: Secondary | ICD-10-CM | POA: Diagnosis not present

## 2016-02-02 DIAGNOSIS — R634 Abnormal weight loss: Secondary | ICD-10-CM | POA: Diagnosis not present

## 2016-02-02 DIAGNOSIS — N390 Urinary tract infection, site not specified: Secondary | ICD-10-CM | POA: Diagnosis not present

## 2016-02-02 DIAGNOSIS — R32 Unspecified urinary incontinence: Secondary | ICD-10-CM | POA: Diagnosis not present

## 2016-02-02 DIAGNOSIS — Z9981 Dependence on supplemental oxygen: Secondary | ICD-10-CM | POA: Diagnosis not present

## 2016-02-02 DIAGNOSIS — K5790 Diverticulosis of intestine, part unspecified, without perforation or abscess without bleeding: Secondary | ICD-10-CM | POA: Diagnosis not present

## 2016-02-02 DIAGNOSIS — R918 Other nonspecific abnormal finding of lung field: Secondary | ICD-10-CM | POA: Diagnosis not present

## 2016-02-02 DIAGNOSIS — J439 Emphysema, unspecified: Secondary | ICD-10-CM | POA: Diagnosis not present

## 2016-02-02 DIAGNOSIS — R339 Retention of urine, unspecified: Secondary | ICD-10-CM | POA: Diagnosis not present

## 2016-02-02 DIAGNOSIS — E039 Hypothyroidism, unspecified: Secondary | ICD-10-CM | POA: Diagnosis not present

## 2016-02-02 DIAGNOSIS — J9621 Acute and chronic respiratory failure with hypoxia: Secondary | ICD-10-CM | POA: Diagnosis not present

## 2016-02-02 DIAGNOSIS — F419 Anxiety disorder, unspecified: Secondary | ICD-10-CM | POA: Diagnosis not present

## 2016-02-02 DIAGNOSIS — G4736 Sleep related hypoventilation in conditions classified elsewhere: Secondary | ICD-10-CM | POA: Diagnosis not present

## 2016-02-29 ENCOUNTER — Other Ambulatory Visit: Payer: Self-pay | Admitting: *Deleted

## 2016-02-29 DIAGNOSIS — E785 Hyperlipidemia, unspecified: Secondary | ICD-10-CM

## 2016-02-29 MED ORDER — ATORVASTATIN CALCIUM 40 MG PO TABS: 40.0000 mg | ORAL_TABLET | Freq: Every day | ORAL | 3 refills | Status: AC

## 2016-03-04 DIAGNOSIS — G4736 Sleep related hypoventilation in conditions classified elsewhere: Secondary | ICD-10-CM | POA: Diagnosis not present

## 2016-03-04 DIAGNOSIS — R339 Retention of urine, unspecified: Secondary | ICD-10-CM | POA: Diagnosis not present

## 2016-03-04 DIAGNOSIS — N401 Enlarged prostate with lower urinary tract symptoms: Secondary | ICD-10-CM | POA: Diagnosis not present

## 2016-03-04 DIAGNOSIS — Z9981 Dependence on supplemental oxygen: Secondary | ICD-10-CM | POA: Diagnosis not present

## 2016-03-04 DIAGNOSIS — J9621 Acute and chronic respiratory failure with hypoxia: Secondary | ICD-10-CM | POA: Diagnosis not present

## 2016-03-04 DIAGNOSIS — F419 Anxiety disorder, unspecified: Secondary | ICD-10-CM | POA: Diagnosis not present

## 2016-03-04 DIAGNOSIS — K5790 Diverticulosis of intestine, part unspecified, without perforation or abscess without bleeding: Secondary | ICD-10-CM | POA: Diagnosis not present

## 2016-03-04 DIAGNOSIS — Z87891 Personal history of nicotine dependence: Secondary | ICD-10-CM | POA: Diagnosis not present

## 2016-03-04 DIAGNOSIS — N390 Urinary tract infection, site not specified: Secondary | ICD-10-CM | POA: Diagnosis not present

## 2016-03-04 DIAGNOSIS — R32 Unspecified urinary incontinence: Secondary | ICD-10-CM | POA: Diagnosis not present

## 2016-03-04 DIAGNOSIS — J309 Allergic rhinitis, unspecified: Secondary | ICD-10-CM | POA: Diagnosis not present

## 2016-03-04 DIAGNOSIS — E039 Hypothyroidism, unspecified: Secondary | ICD-10-CM | POA: Diagnosis not present

## 2016-03-04 DIAGNOSIS — J441 Chronic obstructive pulmonary disease with (acute) exacerbation: Secondary | ICD-10-CM | POA: Diagnosis not present

## 2016-03-04 DIAGNOSIS — J439 Emphysema, unspecified: Secondary | ICD-10-CM | POA: Diagnosis not present

## 2016-03-04 DIAGNOSIS — R918 Other nonspecific abnormal finding of lung field: Secondary | ICD-10-CM | POA: Diagnosis not present

## 2016-03-04 DIAGNOSIS — R634 Abnormal weight loss: Secondary | ICD-10-CM | POA: Diagnosis not present

## 2016-03-05 DIAGNOSIS — J439 Emphysema, unspecified: Secondary | ICD-10-CM | POA: Diagnosis not present

## 2016-03-05 DIAGNOSIS — Z9981 Dependence on supplemental oxygen: Secondary | ICD-10-CM | POA: Diagnosis not present

## 2016-03-05 DIAGNOSIS — J441 Chronic obstructive pulmonary disease with (acute) exacerbation: Secondary | ICD-10-CM | POA: Diagnosis not present

## 2016-03-05 DIAGNOSIS — G4736 Sleep related hypoventilation in conditions classified elsewhere: Secondary | ICD-10-CM | POA: Diagnosis not present

## 2016-03-05 DIAGNOSIS — F419 Anxiety disorder, unspecified: Secondary | ICD-10-CM | POA: Diagnosis not present

## 2016-03-05 DIAGNOSIS — J9621 Acute and chronic respiratory failure with hypoxia: Secondary | ICD-10-CM | POA: Diagnosis not present

## 2016-03-06 DIAGNOSIS — J441 Chronic obstructive pulmonary disease with (acute) exacerbation: Secondary | ICD-10-CM | POA: Diagnosis not present

## 2016-03-06 DIAGNOSIS — J9621 Acute and chronic respiratory failure with hypoxia: Secondary | ICD-10-CM | POA: Diagnosis not present

## 2016-03-06 DIAGNOSIS — G4736 Sleep related hypoventilation in conditions classified elsewhere: Secondary | ICD-10-CM | POA: Diagnosis not present

## 2016-03-06 DIAGNOSIS — Z9981 Dependence on supplemental oxygen: Secondary | ICD-10-CM | POA: Diagnosis not present

## 2016-03-06 DIAGNOSIS — J439 Emphysema, unspecified: Secondary | ICD-10-CM | POA: Diagnosis not present

## 2016-03-06 DIAGNOSIS — F419 Anxiety disorder, unspecified: Secondary | ICD-10-CM | POA: Diagnosis not present

## 2016-03-08 DIAGNOSIS — Z9981 Dependence on supplemental oxygen: Secondary | ICD-10-CM | POA: Diagnosis not present

## 2016-03-08 DIAGNOSIS — F419 Anxiety disorder, unspecified: Secondary | ICD-10-CM | POA: Diagnosis not present

## 2016-03-08 DIAGNOSIS — J441 Chronic obstructive pulmonary disease with (acute) exacerbation: Secondary | ICD-10-CM | POA: Diagnosis not present

## 2016-03-08 DIAGNOSIS — G4736 Sleep related hypoventilation in conditions classified elsewhere: Secondary | ICD-10-CM | POA: Diagnosis not present

## 2016-03-08 DIAGNOSIS — J9621 Acute and chronic respiratory failure with hypoxia: Secondary | ICD-10-CM | POA: Diagnosis not present

## 2016-03-08 DIAGNOSIS — J439 Emphysema, unspecified: Secondary | ICD-10-CM | POA: Diagnosis not present

## 2016-03-11 DIAGNOSIS — J441 Chronic obstructive pulmonary disease with (acute) exacerbation: Secondary | ICD-10-CM | POA: Diagnosis not present

## 2016-03-11 DIAGNOSIS — F419 Anxiety disorder, unspecified: Secondary | ICD-10-CM | POA: Diagnosis not present

## 2016-03-11 DIAGNOSIS — J9621 Acute and chronic respiratory failure with hypoxia: Secondary | ICD-10-CM | POA: Diagnosis not present

## 2016-03-11 DIAGNOSIS — Z9981 Dependence on supplemental oxygen: Secondary | ICD-10-CM | POA: Diagnosis not present

## 2016-03-11 DIAGNOSIS — J439 Emphysema, unspecified: Secondary | ICD-10-CM | POA: Diagnosis not present

## 2016-03-11 DIAGNOSIS — G4736 Sleep related hypoventilation in conditions classified elsewhere: Secondary | ICD-10-CM | POA: Diagnosis not present

## 2016-03-13 DIAGNOSIS — J441 Chronic obstructive pulmonary disease with (acute) exacerbation: Secondary | ICD-10-CM | POA: Diagnosis not present

## 2016-03-13 DIAGNOSIS — J439 Emphysema, unspecified: Secondary | ICD-10-CM | POA: Diagnosis not present

## 2016-03-13 DIAGNOSIS — G4736 Sleep related hypoventilation in conditions classified elsewhere: Secondary | ICD-10-CM | POA: Diagnosis not present

## 2016-03-13 DIAGNOSIS — Z9981 Dependence on supplemental oxygen: Secondary | ICD-10-CM | POA: Diagnosis not present

## 2016-03-13 DIAGNOSIS — F419 Anxiety disorder, unspecified: Secondary | ICD-10-CM | POA: Diagnosis not present

## 2016-03-13 DIAGNOSIS — J9621 Acute and chronic respiratory failure with hypoxia: Secondary | ICD-10-CM | POA: Diagnosis not present

## 2016-03-14 DIAGNOSIS — F419 Anxiety disorder, unspecified: Secondary | ICD-10-CM | POA: Diagnosis not present

## 2016-03-14 DIAGNOSIS — G4736 Sleep related hypoventilation in conditions classified elsewhere: Secondary | ICD-10-CM | POA: Diagnosis not present

## 2016-03-14 DIAGNOSIS — J9621 Acute and chronic respiratory failure with hypoxia: Secondary | ICD-10-CM | POA: Diagnosis not present

## 2016-03-14 DIAGNOSIS — J439 Emphysema, unspecified: Secondary | ICD-10-CM | POA: Diagnosis not present

## 2016-03-14 DIAGNOSIS — J441 Chronic obstructive pulmonary disease with (acute) exacerbation: Secondary | ICD-10-CM | POA: Diagnosis not present

## 2016-03-14 DIAGNOSIS — Z9981 Dependence on supplemental oxygen: Secondary | ICD-10-CM | POA: Diagnosis not present

## 2016-03-18 ENCOUNTER — Encounter: Payer: Self-pay | Admitting: Family Medicine

## 2016-03-18 DIAGNOSIS — F419 Anxiety disorder, unspecified: Secondary | ICD-10-CM | POA: Diagnosis not present

## 2016-03-18 DIAGNOSIS — Z9981 Dependence on supplemental oxygen: Secondary | ICD-10-CM | POA: Diagnosis not present

## 2016-03-18 DIAGNOSIS — J441 Chronic obstructive pulmonary disease with (acute) exacerbation: Secondary | ICD-10-CM | POA: Diagnosis not present

## 2016-03-18 DIAGNOSIS — J439 Emphysema, unspecified: Secondary | ICD-10-CM | POA: Diagnosis not present

## 2016-03-18 DIAGNOSIS — J9621 Acute and chronic respiratory failure with hypoxia: Secondary | ICD-10-CM | POA: Diagnosis not present

## 2016-03-18 DIAGNOSIS — G4736 Sleep related hypoventilation in conditions classified elsewhere: Secondary | ICD-10-CM | POA: Diagnosis not present

## 2016-03-18 NOTE — Progress Notes (Signed)
[facility] [place] [facility] around 03/14/16 providing an Update with information on patient's upcoming Discharge from hospice services.  Discharge Date: 03/22/16  Next follow-up apt with me (PCP) 03/25/16, then next visit with Pulmonology 03/28/16 (Dr Nicholos Johnsamachandran, [facility], to re-certify for oxygen therapy), [facility]  Hospice Discharge Summary (per Zannie Coveeressa Campbell RN, ph [phone] or [phone]) - Continuously wearing supplemental O2 3L - Duoneb QID daily - Budesonide nebs BID  Patient has demonstrated stability with the following: - No further URI / exacerbations. No med changes - Last antibiotic 12/2015 (good response) - No increase in oxygen settings - No change in sputum production. Continues to have an occasional productive cough chronically, mostly clear. Improved with guaifenesin and nebulizer treatments - Ability to get out of home occasionally, able to drive himself short distances rarely - No decline in functional status. Able to ambulate easily with walker, and recently able to ambulate without walker indoors - No weight loss. Current weight 123 lb (stable from last certification) - Good other symptom management with current medications. Controlled anxiety related to dyspnea with Lorazepam 0.5mg  (half to 1 whole tab) 1-2 times daily PRN  Updated Medication List: - Supplemental Oxygen 3L continuous 24 hr - Duoneb q 6 hour PRN dyspnea (using currently QID daily) - Budesonide 0.25/52ml solution - nebulized BID, mix with duoneb - Prednisone 20mg  daily - Flomax 0.4mg  x 2 capsules (dose 0.8mg ) nightly - Finasteride 5mg  daily - Lisinopril 5mg  daily - Atorvastatin 40mg  daily - Colace 100mg  1-2 caps daily PRN constipation  Other PRN: - Lorazepam 1mg  tab - take 0.5 - 1 tab q 4 hr PRN anxiety (will change to above recommendation) - Combivent inhaler - one puff q 6 hr PRN (if out of home unable to use nebulizer) -  Albuterol inhaler - 2 puffs q 6 hr PRN dyspnea - Gabapentin 100mg  - 1 capsules q 8 hr PRN pain - Loratadine 10mg  daily PRN allergies - BC Powders - one packet q 6 hr PRN headache/pain - Fluticasone - 2 sprays daily PRN for rhinitis - Mirtazapine 15mg  - 0.5 to 1 tab bed time PRN insomnia - Baclofen 10mg  - take 0.5 - 1 tab q 8 hr PRN muscle spasm / pain (back) - Acetaminophen 500mg  - take 1-2 tabs q 8 hr PRN pain - Guaifenesin 100mg /355ml syrup q 4 hr PRN expectorant  Preferred pharmacy after hospice discharge, [facility].  *Note this documentation signed and to be scanned into chart - this is mostly a transcription to keep electronic copy on chart for easier access*  Connor PilarAlexander Jaxten Brosh, DO [facility] 03/18/2016, 1:32 PM

## 2016-03-19 DIAGNOSIS — J9621 Acute and chronic respiratory failure with hypoxia: Secondary | ICD-10-CM | POA: Diagnosis not present

## 2016-03-19 DIAGNOSIS — F419 Anxiety disorder, unspecified: Secondary | ICD-10-CM | POA: Diagnosis not present

## 2016-03-19 DIAGNOSIS — J441 Chronic obstructive pulmonary disease with (acute) exacerbation: Secondary | ICD-10-CM | POA: Diagnosis not present

## 2016-03-19 DIAGNOSIS — Z9981 Dependence on supplemental oxygen: Secondary | ICD-10-CM | POA: Diagnosis not present

## 2016-03-19 DIAGNOSIS — G4736 Sleep related hypoventilation in conditions classified elsewhere: Secondary | ICD-10-CM | POA: Diagnosis not present

## 2016-03-19 DIAGNOSIS — J439 Emphysema, unspecified: Secondary | ICD-10-CM | POA: Diagnosis not present

## 2016-03-21 DIAGNOSIS — J9621 Acute and chronic respiratory failure with hypoxia: Secondary | ICD-10-CM | POA: Diagnosis not present

## 2016-03-21 DIAGNOSIS — G4736 Sleep related hypoventilation in conditions classified elsewhere: Secondary | ICD-10-CM | POA: Diagnosis not present

## 2016-03-21 DIAGNOSIS — F419 Anxiety disorder, unspecified: Secondary | ICD-10-CM | POA: Diagnosis not present

## 2016-03-21 DIAGNOSIS — Z9981 Dependence on supplemental oxygen: Secondary | ICD-10-CM | POA: Diagnosis not present

## 2016-03-21 DIAGNOSIS — J439 Emphysema, unspecified: Secondary | ICD-10-CM | POA: Diagnosis not present

## 2016-03-21 DIAGNOSIS — J441 Chronic obstructive pulmonary disease with (acute) exacerbation: Secondary | ICD-10-CM | POA: Diagnosis not present

## 2016-03-22 DIAGNOSIS — F419 Anxiety disorder, unspecified: Secondary | ICD-10-CM | POA: Diagnosis not present

## 2016-03-22 DIAGNOSIS — Z9981 Dependence on supplemental oxygen: Secondary | ICD-10-CM | POA: Diagnosis not present

## 2016-03-22 DIAGNOSIS — G4736 Sleep related hypoventilation in conditions classified elsewhere: Secondary | ICD-10-CM | POA: Diagnosis not present

## 2016-03-22 DIAGNOSIS — J441 Chronic obstructive pulmonary disease with (acute) exacerbation: Secondary | ICD-10-CM | POA: Diagnosis not present

## 2016-03-22 DIAGNOSIS — J9621 Acute and chronic respiratory failure with hypoxia: Secondary | ICD-10-CM | POA: Diagnosis not present

## 2016-03-22 DIAGNOSIS — J439 Emphysema, unspecified: Secondary | ICD-10-CM | POA: Diagnosis not present

## 2016-03-25 ENCOUNTER — Ambulatory Visit (INDEPENDENT_AMBULATORY_CARE_PROVIDER_SITE_OTHER): Payer: Medicare Other | Admitting: Family Medicine

## 2016-03-25 ENCOUNTER — Encounter: Payer: Self-pay | Admitting: Family Medicine

## 2016-03-25 VITALS — BP 120/79 | HR 93 | Temp 98.0°F | Resp 16 | Ht 73.2 in | Wt 125.0 lb

## 2016-03-25 DIAGNOSIS — Z9981 Dependence on supplemental oxygen: Secondary | ICD-10-CM

## 2016-03-25 DIAGNOSIS — Z515 Encounter for palliative care: Secondary | ICD-10-CM | POA: Diagnosis not present

## 2016-03-25 DIAGNOSIS — G8929 Other chronic pain: Secondary | ICD-10-CM | POA: Diagnosis not present

## 2016-03-25 DIAGNOSIS — I10 Essential (primary) hypertension: Secondary | ICD-10-CM | POA: Insufficient documentation

## 2016-03-25 DIAGNOSIS — R351 Nocturia: Secondary | ICD-10-CM

## 2016-03-25 DIAGNOSIS — J301 Allergic rhinitis due to pollen: Secondary | ICD-10-CM | POA: Diagnosis not present

## 2016-03-25 DIAGNOSIS — M546 Pain in thoracic spine: Secondary | ICD-10-CM

## 2016-03-25 DIAGNOSIS — N401 Enlarged prostate with lower urinary tract symptoms: Secondary | ICD-10-CM

## 2016-03-25 DIAGNOSIS — F5101 Primary insomnia: Secondary | ICD-10-CM | POA: Diagnosis not present

## 2016-03-25 DIAGNOSIS — J449 Chronic obstructive pulmonary disease, unspecified: Secondary | ICD-10-CM

## 2016-03-25 DIAGNOSIS — J9611 Chronic respiratory failure with hypoxia: Secondary | ICD-10-CM

## 2016-03-25 DIAGNOSIS — F419 Anxiety disorder, unspecified: Secondary | ICD-10-CM | POA: Insufficient documentation

## 2016-03-25 DIAGNOSIS — G4700 Insomnia, unspecified: Secondary | ICD-10-CM | POA: Insufficient documentation

## 2016-03-25 MED ORDER — FLUTICASONE PROPIONATE 50 MCG/ACT NA SUSP: 2.0000 | Freq: Every day | NASAL | 11 refills | Status: AC

## 2016-03-25 MED ORDER — TAMSULOSIN HCL 0.4 MG PO CAPS: 0.8000 mg | ORAL_CAPSULE | Freq: Every day | ORAL | 11 refills | Status: DC

## 2016-03-25 MED ORDER — PREDNISONE 20 MG PO TABS: 20.0000 mg | ORAL_TABLET | Freq: Every day | ORAL | 5 refills | Status: DC

## 2016-03-25 MED ORDER — LISINOPRIL 5 MG PO TABS: 5.0000 mg | ORAL_TABLET | Freq: Every day | ORAL | 3 refills | Status: DC

## 2016-03-25 MED ORDER — LISINOPRIL 5 MG PO TABS: 5.0000 mg | ORAL_TABLET | Freq: Every day | ORAL | 3 refills | Status: AC

## 2016-03-25 MED ORDER — IPRATROPIUM-ALBUTEROL 20-100 MCG/ACT IN AERS: 1.0000 | INHALATION_SPRAY | Freq: Four times a day (QID) | RESPIRATORY_TRACT | 11 refills | Status: AC | PRN

## 2016-03-25 MED ORDER — LISINOPRIL 5 MG PO TABS: 5.0000 mg | ORAL_TABLET | Freq: Every day | ORAL | 11 refills | Status: DC

## 2016-03-25 MED ORDER — LORAZEPAM 1 MG PO TABS: 0.5000 mg | ORAL_TABLET | Freq: Three times a day (TID) | ORAL | 2 refills | Status: DC | PRN

## 2016-03-25 MED ORDER — GABAPENTIN 100 MG PO CAPS: 100.0000 mg | ORAL_CAPSULE | Freq: Two times a day (BID) | ORAL | 11 refills | Status: DC | PRN

## 2016-03-25 MED ORDER — TAMSULOSIN HCL 0.4 MG PO CAPS: 0.8000 mg | ORAL_CAPSULE | Freq: Every day | ORAL | 11 refills | Status: AC

## 2016-03-25 MED ORDER — IPRATROPIUM-ALBUTEROL 20-100 MCG/ACT IN AERS: 1.0000 | INHALATION_SPRAY | Freq: Four times a day (QID) | RESPIRATORY_TRACT | 11 refills | Status: DC | PRN

## 2016-03-25 MED ORDER — GABAPENTIN 100 MG PO CAPS
100.0000 mg | ORAL_CAPSULE | Freq: Two times a day (BID) | ORAL | 11 refills | Status: DC | PRN
Start: 2016-03-25 — End: 2016-03-25

## 2016-03-25 MED ORDER — FLUTICASONE PROPIONATE 50 MCG/ACT NA SUSP: 2.0000 | Freq: Every day | NASAL | 11 refills | Status: DC

## 2016-03-25 NOTE — Progress Notes (Deleted)
Connor Lewis* [facility]     Assessment and Plan:  Emphysema/COPD. -Continue Brovana, Pulmicort, when necessary DuoNeb's.   Plan: -Continue with Brovana, Pulmicort, as needed DuoNeb -Pulmonary function testing and 6 MWT prior to follow-up visit -CT Chest with contrast prior to follow up given his recurrent flare ups this year.   Acute on chronic respiratory failure with hypoxia (HCC) Severe COPD Cont with O2 at night, 2L, and with exertion.      Date: 03/25/2016  MRN# [id number] Connor Lewis 1940-08-23   Connor CarasJohn Lewis is a 76 y.o. old male seen in follow up for chief complaint of  No chief complaint on file.    HPI:   The patient is a 76 year old male, he last was seen by Dr. Dema SeverinMungal in our office, at that time it was noted that he had a progressive decline in his respiratory and functional status, and it was thought that he had end-stage COPD. At last visit he was asked to continue Brovana, Pulmicort, DuoNeb, 2 L of oxygen at night and with exertion.he has since been enrolled in hospice, and is here for oxygen recertification.  Medication:   Outpatient Encounter Prescriptions as of 03/28/2016  Medication Sig  . albuterol (PROVENTIL HFA;VENTOLIN HFA) 108 (90 Base) MCG/ACT inhaler Inhale 2 puffs into the lungs every 6 (six) hours as needed for wheezing or shortness of breath.  Marland Kitchen. arformoterol (BROVANA) 15 MCG/2ML NEBU Take 2 mLs (15 mcg total) by nebulization 2 (two) times daily.  Marland Kitchen. atorvastatin (LIPITOR) 40 MG tablet Take 1 tablet (40 mg total) by mouth daily.  . baclofen (LIORESAL) 10 MG tablet Take 0.5-1 tablets (5-10 mg total) by mouth 3 (three) times daily as needed for muscle spasms.  . budesonide (PULMICORT) 0.5 MG/2ML nebulizer solution Take 2 mLs (0.5 mg total) by nebulization 2 (two) times daily.  . carbamide peroxide (EAR WAX REMOVAL DROPS) 6.5 % otic solution Place 5 drops into both ears 2 (two) times daily. (Patient taking differently: Place 5 drops into  both ears 2 (two) times daily as needed (ear wax). )  . finasteride (PROSCAR) 5 MG tablet Take 1 tablet (5 mg total) by mouth daily.  . fluticasone (FLONASE) 50 MCG/ACT nasal spray Place 2 sprays into both nostrils daily.  . fluticasone (FLOVENT HFA) 220 MCG/ACT inhaler Inhale 2 puffs into the lungs 2 (two) times daily.  Marland Kitchen. gabapentin (NEURONTIN) 100 MG capsule Take 1 capsule (100 mg total) by mouth 3 (three) times daily as needed (nerve pain).  Marland Kitchen. HYDROcodone-acetaminophen (NORCO/VICODIN) 5-325 MG tablet Take 1 tablet by mouth every 4 (four) hours as needed. for pain  . Ipratropium-Albuterol (COMBIVENT) 20-100 MCG/ACT AERS respimat Inhale 1 puff into the lungs every 6 (six) hours.  Marland Kitchen. lisinopril (PRINIVIL,ZESTRIL) 5 MG tablet Take 1 tablet (5 mg total) by mouth daily.  Marland Kitchen. LORazepam (ATIVAN) 1 MG tablet Take 0.5-1 tablets (0.5-1 mg total) by mouth at bedtime as needed for anxiety or sleep.  . Meth-Hyo-M Bl-Na Phos-Ph Sal (URIBEL) 118 MG CAPS Take 1 capsule (118 mg total) by mouth QID.  Marland Kitchen. mirtazapine (REMERON SOL-TAB) 15 MG disintegrating tablet Take 1 tablet (15 mg total) by mouth at bedtime.  . Nutritional Supplements (SCANDISHAKE) MISC Take 1 each by mouth daily.  . OXYGEN Inhale 2-3 L into the lungs continuous.  . predniSONE (DELTASONE) 20 MG tablet Take 1 tablet (20 mg total) by mouth daily.  . tamsulosin (FLOMAX) 0.4 MG CAPS capsule Take 2 capsules (0.8 mg total) by mouth at bedtime.  No facility-administered encounter medications on file as of 03/28/2016.      Allergies:  Doxycycline and Sulfa antibiotics  Review of Systems: Gen:  Denies  fever, sweats. HEENT: Denies blurred vision. Cvc:  No dizziness, chest pain or heaviness Resp:   Denies cough or sputum porduction. Gi: Denies swallowing difficulty, stomach pain. constipation, bowel incontinence Gu:  Denies bladder incontinence, burning urine Ext:   No Joint pain, stiffness. Skin: No skin rash, easy bruising. Endoc:  No polyuria,  polydipsia. Psych: No depression, insomnia. Other:  All other systems were reviewed and found to be negative other than what is mentioned in the HPI.   Physical Examination:   VS: There were no vitals taken for this visit.  General Appearance: No distress  Neuro:without focal findings,  speech normal,  HEENT: PERRLA, EOM intact. Pulmonary: normal breath sounds, No wheezing.   CardiovascularNormal S1,S2.  No m/r/g.   Abdomen: Benign, Soft, non-tender. Renal:  No costovertebral tenderness  GU:  Not performed at this time. Endoc: No evident thyromegaly, no signs of acromegaly. Skin:   warm, no rash. Extremities: normal, no cyanosis, clubbing.   LABORATORY PANEL:   CBC No results for input(s): WBC, HGB, HCT, PLT in the last 168 hours. ------------------------------------------------------------------------------------------------------------------  Chemistries  No results for input(s): NA, K, CL, CO2, GLUCOSE, BUN, CREATININE, CALCIUM, MG, AST, ALT, ALKPHOS, BILITOT in the last 168 hours.  Invalid input(s): GFRCGP ------------------------------------------------------------------------------------------------------------------  Cardiac Enzymes No results for input(s): TROPONINI in the last 168 hours. ------------------------------------------------------------  RADIOLOGY:   No results found for this or any previous visit. Results for orders placed during the hospital encounter of 09/27/15  DG Chest 2 View   Narrative CLINICAL DATA:  Shortness of breath and chest tightness EXAM: CHEST  2 VIEW COMPARISON:  August 03, 2015 FINDINGS: There is underlying emphysematous change, stable. Lungs are somewhat hyperexpanded. There is no edema or consolidation. Heart size is within normal limits for underlying emphysema. The pulmonary vascular reflects underlying emphysema and is stable. There is atherosclerotic calcification in the aortic arch. No bone lesions are evident. No  adenopathy. IMPRESSION: Underlying emphysematous change. No edema or consolidation. Aortic atherosclerosis. Electronically Signed   By: Bretta Bang III M.D.   On: 09/27/2015 16:15   ------------------------------------------------------------------------------------------------------------------  Thank  you for allowing [facility] to assist in the care of your patient. Our recommendations are noted above.  Please contact us if we can be of further service.   Wells Guiles, MD.  [facility] Office Number: [phone]  Santiago Glad, M.D.  Stephanie Acre, M.D.  Billy Fischer, M.D  03/25/2016

## 2016-03-25 NOTE — Progress Notes (Signed)
Subjective:    Patient ID: Connor Lewis, male    DOB: 07/18/1940, 76 y.o.   MRN: [id number]  Connor Lewis is a 76 y.o. male presenting on 03/25/2016 for Back Pain (follow up)   HPI   FOLLOW-UP MID / LOW BACK PAIN, Acute on Chronic - Last visit with me on 01/11/16 for acute on chronic mid to low back pain, seemed most consistent with MSK muscle strain in setting of COPD and frequent coughing, added baclofen muscle relaxant to regimen - Today he reports mostly resolved back pain, and states it took a few weeks to gradually improve with the prior treatments. Now without active pain. No longer taking Baclofen, will only take if needs and has not needed it. Also he has been off of Hydrocodone per hospice, and no longer taking this medication. - Takes Tylenol regularly for pain - Denies any fevers/chills, numbness, tingling, weakness, loss of control bladder/bowel incontinence or retention, unintentional wt loss, night sweats  HOSPICE DISCHARGE / Re-Establish with PCP / End-Stage COPD / Chronic Respiratory Failure - Patient had been primarily managed by [facility] since end of summer early Fall 2017, approx total time >4 months, diagnosis with End-Stage COPD and chronic respiratory failure had declined functional status following hospitalization. Overall he has progressed very well on hospice management, also seeing Pulmonology. - He was discharged from hospice recently on /19/18 due to clinical improvement to a stable baseline without decline, received paperwork detailing his progress, areas of improvement, and current status, along with complete med list, see chart documentation for this full report. - Today requesting refills on medications to ensure has them, to [facility] [facility] - Continues regular nebulizer treatments with Duoneb QID and Budesonide BID - Remains on chronic supplemental oxygen, now awaiting Pulmonology visit on 03/29/16 for  re-certifying O2, was on 3L prior. Remains limited with longer distance ambulation but appears to be more functional at home now. - Describes need refill on Combivent 20/19mcg inhaler, this dramatically improves his function as he can use it quickly if has to get up overnight to void and tolerate ambulation, instead of waiting on nebulizer - Other significant medication changes - no longer on hydrocodone, discontinued as it was no longer needed. Now still taking Lorazepam started during hospice management, helps his anxiety, sleep at night, and helps calm down his breathing with COPD, taking half tab for 0.5mg  dose about TID - Off Remeron, no longer taking this, not effective   Social History  Substance Use Topics  . Smoking status: Former Smoker    Packs/day: 1.00    Years: 56.00  . Smokeless tobacco: Former Neurosurgeon     Comment: quit in 2015  . Alcohol use 0.0 oz/week     Comment: occ    Review of Systems Per HPI unless specifically indicated above     Objective:    BP 120/79   Pulse 93   Temp 98 F (36.7 C) (Oral)   Resp 16   Ht 6' 1.2" (1.859 m)   Wt 125 lb (56.7 kg)   BMI 16.40 kg/m   Wt Readings from Last 3 Encounters:  03/25/16 125 lb (56.7 kg)  01/11/16 128 lb (58.1 kg)  09/27/15 122 lb (55.3 kg)    Physical Exam  Constitutional: He is oriented to person, place, and time. He appears well-developed and well-nourished. No distress.  Chronically ill but currently well appearing, elderly 5 male appears older than stated age, cachetic, comfortable, cooperative.  No portable oxygen with him today. Has cane.  HENT:  Head: Normocephalic and atraumatic.  Improved, mildly moist mucus mem  Eyes: Conjunctivae and EOM are normal. Pupils are equal, round, and reactive to light. Right eye exhibits no discharge. Left eye exhibits no discharge.  Cardiovascular: Normal rate, regular rhythm, normal heart sounds and intact distal pulses.   No murmur heard. Pulmonary/Chest: Effort  normal. No respiratory distress. He has no wheezes. He has no rales. He exhibits no tenderness.  Improved lungs now with persistent stable baseline diffuse reduced air movement. No focal crackles or coarse breath sounds. No coughing. Speaks full sentences.  Musculoskeletal: He exhibits no edema.  Mid / Low Back Inspection: Thin body habitus with muscle wasting. No spinal deformity. Palpation: No tenderness over spinous processes. Improved now without tenderness Right side thoracic paraspinal muscles. ROM: Improved but still some limited ROM forward flex / back extension with stiffness. Strength: Bilateral hip flex/ext 5/5, knee flex/ext 5/5, ankle dorsiflex/plantarflex 5/5 Neurovascular: intact distal sensation to light touch  Neurological: He is alert and oriented to person, place, and time.  Intention tremors bilateral upper extremity  Skin: Skin is warm and dry. No rash noted. He is not diaphoretic.  Psychiatric: He has a normal mood and affect. His behavior is normal.  Well groomed, good eye contact, normal speech and thoughts  Nursing note and vitals reviewed.   Results for orders placed or performed during the hospital encounter of 09/27/15  Basic metabolic panel  Result Value Ref Range   Sodium 136 135 - 145 mmol/L   Potassium 3.2 (L) 3.5 - 5.1 mmol/L   Chloride 98 (L) 101 - 111 mmol/L   CO2 27 22 - 32 mmol/L   Glucose, Bld 252 (H) 65 - 99 mg/dL   BUN 16 6 - 20 mg/dL   Creatinine, Ser 1.61 0.61 - 1.24 mg/dL   Calcium 9.5 8.9 - 09.6 mg/dL   GFR calc non Af Amer 58 (L) >60 mL/min   GFR calc Af Amer >60 >60 mL/min   Anion gap 11 5 - 15  CBC  Result Value Ref Range   WBC 12.4 (H) 3.8 - 10.6 K/uL   RBC 5.14 4.40 - 5.90 MIL/uL   Hemoglobin 14.7 13.0 - 18.0 g/dL   HCT 04.5 40.9 - 81.1 %   MCV 84.8 80.0 - 100.0 fL   MCH 28.6 26.0 - 34.0 pg   MCHC 33.7 32.0 - 36.0 g/dL   RDW 91.4 78.2 - 95.6 %   Platelets 224 150 - 440 K/uL  Troponin I  Result Value Ref Range   Troponin I  0.03 (HH) <0.03 ng/mL  Urinalysis complete, with microscopic ([facility] only)  Result Value Ref Range   Color, Urine YELLOW (A) YELLOW   APPearance CLEAR (A) CLEAR   Glucose, UA 150 (A) NEGATIVE mg/dL   Bilirubin Urine NEGATIVE NEGATIVE   Ketones, ur NEGATIVE NEGATIVE mg/dL   Specific Gravity, Urine 1.014 1.005 - 1.030   Hgb urine dipstick 1+ (A) NEGATIVE   pH 6.0 5.0 - 8.0   Protein, ur 100 (A) NEGATIVE mg/dL   Nitrite NEGATIVE NEGATIVE   Leukocytes, UA NEGATIVE NEGATIVE   RBC / HPF 6-30 0 - 5 RBC/hpf   WBC, UA 0-5 0 - 5 WBC/hpf   Bacteria, UA NONE SEEN NONE SEEN   Squamous Epithelial / LPF 0-5 (A) NONE SEEN      Assessment & Plan:   Problem List Items Addressed This Visit    Insomnia  Refilled Lorazepam. No improvement on Mirtazapine, discontinued.      Relevant Medications   LORazepam (ATIVAN) 1 MG tablet   Hypertension    Stable, controlled on low dose ACEi      Relevant Medications   lisinopril (PRINIVIL,ZESTRIL) 5 MG tablet   Hospice care patient - Primary    [facility], now I plan to resume primary management as PCP. - See separate documentation entry 03/18/16 with hospice detailed discharge report - Follow-up with Pulm 03/29/16 for supplemental O2 recert - Refilled meds today, will continue Lorazepam started in hospice, Hydrocodone not refilled (no longer needed) - Monitor regularly and if significant functional decline will notify hospice again if needed      COPD mixed type (HCC)    Stable, discharged from hospice, see A&P      Relevant Medications   LORazepam (ATIVAN) 1 MG tablet   Ipratropium-Albuterol (COMBIVENT) 20-100 MCG/ACT AERS respimat   fluticasone (FLONASE) 50 MCG/ACT nasal spray   predniSONE (DELTASONE) 20 MG tablet   Chronic right-sided back pain    Improved, stable. Suspect some baseline chronic MSK pain with osteoarthritis and cachectic thin appearance overall, susceptible to minor MSK injuries - Remains off Hydrocodone -  Offered refill Baclofen for PRN use, patient declines today will notify office if needs rx - Continue conservative Tylenol / Heating pad      Relevant Medications   gabapentin (NEURONTIN) 100 MG capsule   predniSONE (DELTASONE) 20 MG tablet   Chronic respiratory failure with hypoxia, on home oxygen therapy (HCC)    Stable end stage severe COPD with chronic resp failure requiring supplemental O2, now improved to stable on hospice for months, discharged due to improvement on 03/22/16, however still requires scheduled nebulizers (duoneb, budesonide), supplemental O2 among other treatments. Function is improved but limited overall by severe COPD  Plan: 1. Refilled Combivent to allow improved function at home use PRN especially waking up overnight or if away from home 2. Refilled Prednisone 20mg  daily chronic dose - discussion from last visit he had been on variety of lower tapered doses but no improvement, this is dose he has best results on, cautioned on cannot stop it cold [place], would need to wean or notify office if needs to reduce dose or caution on missing few days of doses 3. Follow-up with Pulmonology 03/29/16 for supplemental oxygen re certification, also defer nebulizer refills to pulm      Relevant Medications   Ipratropium-Albuterol (COMBIVENT) 20-100 MCG/ACT AERS respimat   predniSONE (DELTASONE) 20 MG tablet   BPH associated with nocturia    Stable voiding currently with nocturia and some LUTS Refilled Flomax Defer Finasteride to Urology      Relevant Medications   tamsulosin (FLOMAX) 0.4 MG CAPS capsule   Anxiety    Secondary to significant chronic illness, recent hospice course and clinical decline, however he has improved with respiratory status and has held stable for several months, Lorazepam was started during hospice management, now with noticeable functional improvement on it  Plan: 1. Refilled Lorazepam 1mg  tabs, take half tab 0.5mg  dose TID, #45 tabs with +2 refills  printed given to patient today - counseled on risks / side effects dependence, caution fall risk if taking too much 2. Continue other management for now 3. Follow-up      Relevant Medications   LORazepam (ATIVAN) 1 MG tablet   Allergic rhinitis    Re-order Flonase for regular use symptom control to reduce affecting his respiratory status  Relevant Medications   fluticasone (FLONASE) 50 MCG/ACT nasal spray      Meds ordered this encounter  Medications  .               .               .               Marland Kitchen               Marland Kitchen               Marland Kitchen               Marland Kitchen               Marland Kitchen               .               . lisinopril (PRINIVIL,ZESTRIL) 5 MG tablet    Sig: Take 1 tablet (5 mg total) by mouth daily.    Dispense:  90 tablet    Refill:  3  . Ipratropium-Albuterol (COMBIVENT) 20-100 MCG/ACT AERS respimat    Sig: Inhale 1 puff into the lungs every 6 (six) hours as needed for wheezing or shortness of breath.    Dispense:  1 Inhaler    Refill:  11  . fluticasone (FLONASE) 50 MCG/ACT nasal spray    Sig: Place 2 sprays into both nostrils daily.    Dispense:  16 g    Refill:  11  . tamsulosin (FLOMAX) 0.4 MG CAPS capsule    Sig: Take 2 capsules (0.8 mg total) by mouth at bedtime.    Dispense:  60 capsule    Refill:  11  . gabapentin (NEURONTIN) 100 MG capsule    Sig: Take 1 capsule (100 mg total) by mouth 2 (two) times daily as needed (nerve pain).    Dispense:  60 capsule    Refill:  11  . predniSONE (DELTASONE) 20 MG tablet    Sig: Take 1 tablet (20 mg total) by mouth daily.    Dispense:  30 tablet    Refill:  5     Follow up plan: Return in about 6 weeks (around 05/06/2016) for  medication refills.  Saralyn Pilar, DO [facility] 03/26/2016, 6:34 AM

## 2016-03-25 NOTE — Patient Instructions (Signed)
Thank you for coming in to clinic today.  1. Refills sent to pharmacy  - We had a problem sending refill of Flonase, Lisinopril, and Combivent inhaler - we will call Lubertha SouthAsher McAdams to see if there is a problem with these medications - I have printed these as well so you have a copy just incase you need to take to another pharmacy  - Printed Lorazepam for anxiety/sleep - take this half tablet up to 3 times a day regularly, be careful with sedation  - For back pain if you need the Baclofen medicine again in future, just contact our office or ask pharmacy to request refill  Follow-up with [facility] in few days, as scheduled. - Ask Pulmonologist about refilling nebulizer solution medicines along with the oxygen  Please schedule a follow-up appointment with Dr. Althea CharonKaramalegos in 6 weeks follow-up medication refills  If you have any other questions or concerns, please feel free to call the clinic or send a message through MyChart. You may also schedule an earlier appointment if necessary.  Saralyn PilarAlexander Kassadie Pancake, DO [facility]

## 2016-03-26 NOTE — Assessment & Plan Note (Signed)
Secondary to significant chronic illness, recent hospice course and clinical decline, however he has improved with respiratory status and has held stable for several months, Lorazepam was started during hospice management, now with noticeable functional improvement on it  Plan: 1. Refilled Lorazepam 1mg  tabs, take half tab 0.5mg  dose TID, #45 tabs with +2 refills printed given to patient today - counseled on risks / side effects dependence, caution fall risk if taking too much 2. Continue other management for now 3. Follow-up

## 2016-03-26 NOTE — Assessment & Plan Note (Signed)
Stable end stage severe COPD with chronic resp failure requiring supplemental O2, now improved to stable on hospice for months, discharged due to improvement on 03/22/16, however still requires scheduled nebulizers (duoneb, budesonide), supplemental O2 among other treatments. Function is improved but limited overall by severe COPD  Plan: 1. Refilled Combivent to allow improved function at home use PRN especially waking up overnight or if away from home 2. Refilled Prednisone 20mg  daily chronic dose - discussion from last visit he had been on variety of lower tapered doses but no improvement, this is dose he has best results on, cautioned on cannot stop it cold [place], would need to wean or notify office if needs to reduce dose or caution on missing few days of doses 3. Follow-up with Pulmonology 03/29/16 for supplemental oxygen re certification, also defer nebulizer refills to pulm

## 2016-03-26 NOTE — Assessment & Plan Note (Signed)
Stable, discharged from hospice, see A&P

## 2016-03-26 NOTE — Assessment & Plan Note (Signed)
Improved, stable. Suspect some baseline chronic MSK pain with osteoarthritis and cachectic thin appearance overall, susceptible to minor MSK injuries - Remains off Hydrocodone - Offered refill Baclofen for PRN use, patient declines today will notify office if needs rx - Continue conservative Tylenol / Heating pad

## 2016-03-26 NOTE — Assessment & Plan Note (Signed)
[facility], now I plan to resume primary management as PCP. - See separate documentation entry 03/18/16 with hospice detailed discharge report - Follow-up with Pulm 03/29/16 for supplemental O2 recert - Refilled meds today, will continue Lorazepam started in hospice, Hydrocodone not refilled (no longer needed) - Monitor regularly and if significant functional decline will notify hospice again if needed

## 2016-03-26 NOTE — Assessment & Plan Note (Signed)
Re-order Flonase for regular use symptom control to reduce affecting his respiratory status

## 2016-03-26 NOTE — Assessment & Plan Note (Signed)
Stable, controlled on low dose ACEi

## 2016-03-26 NOTE — Assessment & Plan Note (Signed)
Stable voiding currently with nocturia and some LUTS Refilled Flomax Defer Finasteride to Urology

## 2016-03-26 NOTE — Assessment & Plan Note (Signed)
Refilled Lorazepam. No improvement on Mirtazapine, discontinued.

## 2016-03-28 ENCOUNTER — Encounter: Payer: Self-pay | Admitting: Pulmonary Disease

## 2016-03-28 ENCOUNTER — Ambulatory Visit (INDEPENDENT_AMBULATORY_CARE_PROVIDER_SITE_OTHER): Payer: Medicare Other | Admitting: Pulmonary Disease

## 2016-03-28 ENCOUNTER — Institutional Professional Consult (permissible substitution): Payer: Medicare Other | Admitting: Internal Medicine

## 2016-03-28 VITALS — BP 158/90 | HR 101 | Ht 70.0 in | Wt 127.0 lb

## 2016-03-28 DIAGNOSIS — J449 Chronic obstructive pulmonary disease, unspecified: Secondary | ICD-10-CM

## 2016-03-28 DIAGNOSIS — J9611 Chronic respiratory failure with hypoxia: Secondary | ICD-10-CM

## 2016-03-28 MED ORDER — PREDNISONE 5 MG PO TABS: 15.0000 mg | ORAL_TABLET | Freq: Every day | ORAL | 5 refills | Status: DC

## 2016-03-28 NOTE — Progress Notes (Signed)
PULMONARY OFFICE FOLLOW UP NOTE  PROBLEMS:  Severe COPD Chronic hypoxemic respiratory failure Former smoker - quit 2015  DATA:   INTERVAL HISTORY: Last seen by VM 09/2015 and Hospice initiated. Recently discharged from Hospice due to stability to improvement  SUBJ: Recently discharged from Hospice. Here to re-qualify for long term O2 therapy. He presently wears O2 with exertion and sleep. He remains stable with class III dyspnea. He has minimal cough. Denies CP, fever, purulent sputum, hemoptysis, LE edema and calf tenderness. His medications have been reviewed. He is presently on 20 mg prednisone daily. He has not been able to reduce prednisone dose to less than 10 mg daily  OBJ: Vitals:   03/28/16 1014  BP: (!) 158/90  Pulse: (!) 101  SpO2: 95%  Weight: 127 lb (57.6 kg)  Height: 5\' 10"  (1.778 m)  RA  SpO2 87% after ambulation on RA  Gen: NAD HEENT: All WNL Neck: NO LAN, no JVD noted Lungs: diminished BS without adventitious sounds Cardiovascular: Reg rate, normal rhythm, no M noted Abdomen: Soft, NT +BS Ext: no C/C/E Neuro: CNs intact, motor/sens grossly intact Skin: No lesions noted  DATA: No recent CXR, PFTs  IMPRESSION: Chronic obstructive pulmonary disease, unspecified COPD type (HCC) - Plan: Ambulatory Referral for DME  Chronic hypoxemic respiratory failure (HCC)   PLAN: 1) Continue nebulized budesonide and arformoterol BID 2) Continue nebulized albuterol PRN (He is presently using up to 4 times per day) 3) We will re-qualify for O2 to be continued with sleep and exertion 4) Decrease prednisone to 15 mg daily (I have prescribed 5 mg tablets - 3 tabs daily) 5) ROV 6 weeks - Consider LAMA @ that time   Billy Fischeravid Simonds, MD PCCM service Mobile [phone] Pager [phone] 03/28/2016

## 2016-03-28 NOTE — Patient Instructions (Addendum)
Continue current meds as prescribed and as listed except change prednisone to 15 mg daily - I have prescribed 5 mg tablets. Take 3 daily until follow up  We will get you re-qualified for home oxygen  Follow up in 6 weeks

## 2016-04-01 ENCOUNTER — Encounter: Payer: Self-pay | Admitting: Family Medicine

## 2016-04-19 ENCOUNTER — Other Ambulatory Visit: Payer: Self-pay

## 2016-04-19 DIAGNOSIS — J410 Simple chronic bronchitis: Secondary | ICD-10-CM

## 2016-04-19 MED ORDER — ARFORMOTEROL TARTRATE 15 MCG/2ML IN NEBU: 15.0000 ug | INHALATION_SOLUTION | Freq: Two times a day (BID) | RESPIRATORY_TRACT | 11 refills | Status: DC

## 2016-04-19 MED ORDER — BUDESONIDE 0.5 MG/2ML IN SUSP: 0.5000 mg | Freq: Two times a day (BID) | RESPIRATORY_TRACT | 12 refills | Status: DC

## 2016-04-23 ENCOUNTER — Telehealth: Payer: Self-pay | Admitting: Pulmonary Disease

## 2016-04-23 DIAGNOSIS — J410 Simple chronic bronchitis: Secondary | ICD-10-CM

## 2016-04-23 MED ORDER — BUDESONIDE 0.5 MG/2ML IN SUSP: 0.5000 mg | Freq: Two times a day (BID) | RESPIRATORY_TRACT | 11 refills | Status: DC

## 2016-04-23 MED ORDER — ARFORMOTEROL TARTRATE 15 MCG/2ML IN NEBU: 15.0000 ug | INHALATION_SOLUTION | Freq: Two times a day (BID) | RESPIRATORY_TRACT | 11 refills | Status: DC

## 2016-04-23 NOTE — Telephone Encounter (Signed)
Brovana and Budesonide have been sent to pharmacy with dx and dx code. Nothing further needed.

## 2016-04-23 NOTE — Telephone Encounter (Signed)
[facility] on [address] calling stating we sent these prescriptions into a pharmacy where they do not take medicare part B  They are calling to see if we can resend in the prescriptions to them Pulmicort and Brovaia  Need the ICD 10 Codes when we send them in, if we do not they can't use the patients insurance  Please advise

## 2016-04-24 MED ORDER — BUDESONIDE 0.5 MG/2ML IN SUSP: 0.5000 mg | Freq: Two times a day (BID) | RESPIRATORY_TRACT | 11 refills | Status: DC

## 2016-04-24 MED ORDER — ARFORMOTEROL TARTRATE 15 MCG/2ML IN NEBU: 15.0000 ug | INHALATION_SOLUTION | Freq: Two times a day (BID) | RESPIRATORY_TRACT | 11 refills | Status: DC

## 2016-04-24 NOTE — Addendum Note (Signed)
Addended by: Meyer CoryAHMAD, MISTY R on: 04/24/2016 05:02 PM   Modules accepted: Orders

## 2016-04-24 NOTE — Telephone Encounter (Signed)
[facility] calling stating they never received the prescription  Please refax to [phone]

## 2016-04-24 NOTE — Telephone Encounter (Signed)
Resent rxs to pharmacy. They will call if they don't receive.

## 2016-05-01 ENCOUNTER — Telehealth: Payer: Self-pay | Admitting: Pulmonary Disease

## 2016-05-01 NOTE — Telephone Encounter (Signed)
Patient cannot afford Pulmicort.  He will be out of medication in a day or so.  Is patient able to get samples drug assistance or switch to another medicaiton.

## 2016-05-01 NOTE — Telephone Encounter (Signed)
[facility] and [facility] [place] PA from Medicare Part d and medicaid. They are faxing over paper with number to call.

## 2016-05-03 NOTE — Telephone Encounter (Signed)
Initiated PA thru [employer] for [place]. Has been sent for review.   [id number]    Will await response.

## 2016-05-07 ENCOUNTER — Ambulatory Visit (INDEPENDENT_AMBULATORY_CARE_PROVIDER_SITE_OTHER): Payer: Medicare Other | Admitting: Family Medicine

## 2016-05-07 ENCOUNTER — Encounter: Payer: Self-pay | Admitting: Family Medicine

## 2016-05-07 VITALS — BP 141/88 | HR 87 | Temp 98.2°F | Resp 16 | Ht 70.0 in | Wt 124.6 lb

## 2016-05-07 DIAGNOSIS — N3941 Urge incontinence: Secondary | ICD-10-CM

## 2016-05-07 DIAGNOSIS — J449 Chronic obstructive pulmonary disease, unspecified: Secondary | ICD-10-CM

## 2016-05-07 DIAGNOSIS — J9611 Chronic respiratory failure with hypoxia: Secondary | ICD-10-CM | POA: Diagnosis not present

## 2016-05-07 DIAGNOSIS — N401 Enlarged prostate with lower urinary tract symptoms: Secondary | ICD-10-CM | POA: Diagnosis not present

## 2016-05-07 DIAGNOSIS — Z9981 Dependence on supplemental oxygen: Secondary | ICD-10-CM

## 2016-05-07 DIAGNOSIS — G8929 Other chronic pain: Secondary | ICD-10-CM | POA: Diagnosis not present

## 2016-05-07 DIAGNOSIS — Z515 Encounter for palliative care: Secondary | ICD-10-CM

## 2016-05-07 DIAGNOSIS — M546 Pain in thoracic spine: Secondary | ICD-10-CM | POA: Diagnosis not present

## 2016-05-07 DIAGNOSIS — R159 Full incontinence of feces: Secondary | ICD-10-CM

## 2016-05-07 DIAGNOSIS — R152 Fecal urgency: Secondary | ICD-10-CM | POA: Diagnosis not present

## 2016-05-07 DIAGNOSIS — R351 Nocturia: Secondary | ICD-10-CM

## 2016-05-07 MED ORDER — BACLOFEN 10 MG PO TABS: 5.0000 mg | ORAL_TABLET | Freq: Three times a day (TID) | ORAL | 5 refills | Status: AC | PRN

## 2016-05-07 NOTE — Assessment & Plan Note (Signed)
Stable end stage severe COPD with chronic resp failure requiring supplemental O2, scheduled nebulizers - Followed by [facility], managing chronic pred, breathing treatments, O2

## 2016-05-07 NOTE — Patient Instructions (Signed)
Thank you for coming in to clinic today.  1. For breathing - I don't think you have a pneumonia or bronchitis today. No wheezing. Lungs sound clear - If worsening productive cough and or fevers or other symptoms, can notify our office and we can send you Azithromycin z-pak for 5 days antibiotic, otherwise if more severe symptoms with shortness of breath and difficulty then you can go to Emergency Department for more acute evaluation, or check in with Pulmonologist  2. For Brovana (nebulizer) - And possibly the other nebulizer / inhalers - Pulmonologist office is working on sending the Prior Authorization for approval of your medication, it was sent on Friday 05/03/16, I do not see any update that it was approved or any other info - Call Pulmonology to follow-up on this issue  3. For back - I think that this is due to Muscle Spasms or strain. You can have some radiation of pain to front of ribs, and this can be worse with COPD as well. - Refilled Baclofen, may take up to half tab up to 3 times daily as needed, given increase # pills to 60, with plenty of refills  Recommend to start taking Tylenol Extra Strength 500mg  tabs - take 1 to 2 tabs per dose (max 1000mg ) every 6-8 hours for pain (take regularly, don't skip a dose for next 7 days), max 24 hour daily dose is 6 tablets or 3000mg . In the future you can repeat the same everyday Tylenol course for 1-2 weeks at a time.  - Use heating pad as needed for back spasms - Also you are on prednisone chronically with is an anti-inflammatory  This pain may take weeks to months to fully resolve, but hopefully it will respond to the medicine initially. All back injuries (small or serious) are slow to heal since we use our back muscles every day. Be careful with turning, twisting, lifting, sitting / standing for prolonged periods, and avoid re-injury.  ------- Urinary and fecal incontinence - Printed rx for "3 n 1" bedside commode, take this to your medical  supply store, [facility] does some medical supplies as well  ------  Please schedule a follow-up appointment with Dr. Althea CharonKaramalegos in 3 months as needed for chronic back pain, COPD  If you have any other questions or concerns, please feel free to call the clinic or send a message through MyChart. You may also schedule an earlier appointment if necessary.  Saralyn PilarAlexander Synai Prettyman, DO [facility]

## 2016-05-07 NOTE — Telephone Encounter (Signed)
[employer] states that Medicare Part D should cover the medication. It was denied by Medicaid. Will call pharmacy back.    Called pharmacy and they states Medicare Part B states it will cost pt over $100 copay for the Brovana. They state they Pulmicort has been approved with $0 copay. Please advise on Brovana. Pt states he can't afford that price.

## 2016-05-07 NOTE — Assessment & Plan Note (Signed)
Stable end stage severe COPD with chronic resp failure requiring supplemental O2, scheduled nebulizers - Followed by [facility], managing chronic pred, breathing treatments, O2 - Discharged from hospice on 03/22/16 due to clinical improvement  Plan: 1. Continue chronic prednisone per pulm, now on 15mg  daily instead of 20mg  daily (since 03/28/16) 2. Continue current nebs/inhalers - per chart review pending PA for Brovana nebulizer, patient family asking on status, advised to check with Pulmonology, pending as of 05/03/16 3. Continues with supplemental O2 per Pulm 4. Follow-up as needed, as discussed if concerns with inc sputum production, dyspnea, fevers, can return, consider CXR, if mild sputum production may consider azithromycin z-pak if needed, as already on prednisone, but may benefit from Pred burst temporarily

## 2016-05-07 NOTE — Assessment & Plan Note (Signed)
Likely contributing to chronic urinary symptoms with some urinary incontinence as well, in addition to nocturia LUTS - Continue flomax given concern of potential urinary obstruction if not voiding - See A&P incontinence, ordered home DME 3n1 bedside commode

## 2016-05-07 NOTE — Assessment & Plan Note (Signed)
Persistent problem, now recent worsening per caregivers with more limited mobility, unable to make it to bathroom - Does not seem to have spontaneous accidents during day, not indicative of neurological or other complication, seems problem is ambulating to bathroom  Plan: 1. Printed order for 3n1 bedside commode - take to med supply store, follow-up with this as needed 2. Advised follow-up with Palliative for contact hospice for possible HH services, or can notify our office to arrange HH if needed 

## 2016-05-07 NOTE — Assessment & Plan Note (Signed)
Stable, recent minor fall from seated 4 weeks ago without obvious injury. Suspect some baseline chronic MSK pain with osteoarthritis and cachectic thin appearance overall, susceptible to minor MSK injuries - Remains off Hydrocodone  Plan: 1. Refilled Baclofen today - 10mg  tabs, may continue current 0.5mg  tabs BID regularly, can use additional as advised if worse pain, likely safest option and seems to be working well, counseling on avoid opiates or stronger meds due to sedation / fall risk 2. Continue conservative Tylenol / Heating pad 3. Follow-up as needed

## 2016-05-07 NOTE — Progress Notes (Addendum)
Subjective:    Patient ID: Connor Lewis, male    DOB: 04/30/1940, 76 y.o.   MRN: [id number]  Connor Lewis is a 76 y.o. male presenting on 05/07/2016 for Medication Refill (pt has back apin after fall in past pain below neck rediate down to his chest onset 2 weeks)  History primarily provided by patient, additional history from patient's Sister Connor Lewis, visiting from out of town, [place]) and patient's daughter Connor Lewis, who lives locally).  HPI   FOLLOW-UP MID / LOW BACK PAIN, Chronic - Prior history, seen by me 11/9 following acute on chronic injury and then again in 1/22 for same issue in follow-up, with known chronic back pain, muscle spasms and strain related to coughing COPD and limited mobility. Previously improved on Baclofen as needed. During prior hospice care he did receive some hydrocodone PRN, has been off of this for months. - Now new complaint, with recent episode 4 weeks ago with fall, from seated position, thinks he dozed off leaning forward, no significant injury but had some possible worsening mid thoracic back pain, especially worse with reaching forward, occasional radiating pain to front of ribs. 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 - Primary problem is difficulty ambulating to bathroom in time once has urge to void or have BM before he has accident, limited by chronic pain, dyspnea, on portable O2 and some unsteadiness. Asking about DME order for bedside commode. -  Describes fecal incontinence with loose stool, worse overnight occasionally will wake up with urge to void and have fecal incontinence with accident, even during day if voiding, may have fecal incontinence - Also some urge urinary incontinence, same problem, difficulty ambulating to bathroom - Denies any new worsening fecal/urinary incontinence or retention following any of his back pain or injuries, this has been an ongoing issue, does wear depends  Palliative Care Patient / End-Stage COPD / Chronic Respiratory Failure - Prior history followed by [facility] since end of summer early Fall 2017, approx total time >4 months, diagnosis with End-Stage COPD and chronic respiratory failure had declined functional status following hospitalization. Overall he has progressed very well on hospice and discharged on 03/22/16 due to clinical improvement. - He has been followed in interval by Palliative Care, and continues to follow-up with [facility], for supplemental oxygen, breathing treatments, COPD management with chronic prednisone. Interval update, last visit 1/25, lowered prednisone from 20mg  daily to 15mg  daily - Next visit Pulmonology 3/19 - Family caregivers admit he is awaiting response back regarding insurance authorization for Eva and other nebulizer/inhalers due to high cost - Continues regular nebulizer treatments regularly per Pulm - No additional home health assistance, family provides most of care, will consider if need more help at home and contact Palliative, or notify our office to arrange [facility] - Taking Lorazepam 0.5mg  TID PRN - last refilled 1/22 with 3 month supply, patient states not taking every day but does help nerves and calm him down with any breathing difficulties. He did go several days without taking it, and  noticed some worsening symptoms, concern for withdrawal, does not need refill today - Denies any new worsening chest pain,  dyspnea, pain, productive cough, fevers/chills   Social History  Substance Use Topics  . Smoking status: Former Smoker    Packs/day: 1.00    Years: 56.00  . Smokeless tobacco: Former Neurosurgeon     Comment: quit in 2015  . Alcohol use 0.0 oz/week     Comment: occ    Review of Systems Per HPI unless specifically indicated above     Objective:    BP (!) 141/88   Pulse 87   Temp 98.2 F (36.8 C) (Oral)   Resp 16   Ht 5\' 10"  (1.778 m)   Wt 124 lb 9.6 oz (56.5 kg)   SpO2 99%   BMI 17.88 kg/m   Wt Readings from Last 3 Encounters:  05/07/16 124 lb 9.6 oz (56.5 kg)  03/28/16 127 lb (57.6 kg)  03/25/16 125 lb (56.7 kg)    Physical Exam  Constitutional: He is oriented to person, place, and time. He appears well-developed and well-nourished. No distress.  Chronically ill and frail but currently well appearing, elderly 85 male, cachetic, comfortable, cooperative.  HENT:  Head: Normocephalic and atraumatic.  Moist mucus mem  Portable oxygen, via [place]   Eyes: Conjunctivae and EOM are normal. Pupils are equal, round, and reactive to light. Right eye exhibits no discharge. Left eye exhibits no discharge.  Cardiovascular: Normal rate, regular rhythm, normal heart sounds and intact distal pulses.   No murmur heard. Pulmonary/Chest: Effort normal. No respiratory distress. He has no wheezes. He has no rales. He exhibits no tenderness.  Unchanged breath sounds with chronic lung disease.  Persistent baseline diffuse reduced air movement. No focal crackles or coarse breath sounds. No coughing. Speaks full sentences.  Musculoskeletal: He exhibits no edema.  Mid / Low Back Inspection: Thin body habitus with muscle wasting. No spinal deformity. Palpation: No tenderness over spinous processes. Mild persistent similar tenderness bilateral side thoracic paraspinal muscles, near scapular region ROM: Improved but still some limited ROM forward flex / back extension with stiffness. Strength: Bilateral  hip flex/ext 5/5, knee flex/ext 5/5, ankle dorsiflex/plantarflex 5/5 Neurovascular: intact distal sensation to light touch  Neurological: He is alert and oriented to person, place, and time.  Intention tremors bilateral upper extremity  Skin: Skin is warm and dry. No rash noted. He is not diaphoretic.  Psychiatric: He has a normal mood and affect. His behavior is normal.  Well groomed, good eye contact, normal speech and thoughts  Nursing note and vitals reviewed.   I have personally reviewed the following lab results from 09/2015.  Results for orders placed or performed during the hospital encounter of 09/27/15  Basic metabolic panel  Result Value Ref Range   Sodium 136 135 - 145 mmol/L   Potassium 3.2 (L) 3.5 - 5.1 mmol/L   Chloride 98 (L) 101 - 111 mmol/L   CO2 27 22 - 32 mmol/L   Glucose, Bld 252 (H) 65 - 99 mg/dL   BUN 16 6 - 20 mg/dL   Creatinine, Ser 6.96 0.61 - 1.24 mg/dL   Calcium 9.5 8.9 - 29.5 mg/dL   GFR calc non Af Amer 58 (L) >60 mL/min   GFR calc Af Amer >60 >60 mL/min   Anion gap 11 5 - 15  CBC  Result Value Ref Range   WBC 12.4 (H) 3.8 - 10.6 K/uL   RBC 5.14 4.40 - 5.90 MIL/uL   Hemoglobin  14.7 13.0 - 18.0 g/dL   HCT 16.1 09.6 - 04.5 %   MCV 84.8 80.0 - 100.0 fL   MCH 28.6 26.0 - 34.0 pg   MCHC 33.7 32.0 - 36.0 g/dL   RDW 40.9 81.1 - 91.4 %   Platelets 224 150 - 440 K/uL  Troponin I  Result Value Ref Range   Troponin I 0.03 (HH) <0.03 ng/mL  Urinalysis complete, with microscopic ([facility] only)  Result Value Ref Range   Color, Urine YELLOW (A) YELLOW   APPearance CLEAR (A) CLEAR   Glucose, UA 150 (A) NEGATIVE mg/dL   Bilirubin Urine NEGATIVE NEGATIVE   Ketones, ur NEGATIVE NEGATIVE mg/dL   Specific Gravity, Urine 1.014 1.005 - 1.030   Hgb urine dipstick 1+ (A) NEGATIVE   pH 6.0 5.0 - 8.0   Protein, ur 100 (A) NEGATIVE mg/dL   Nitrite NEGATIVE NEGATIVE   Leukocytes, UA NEGATIVE NEGATIVE   RBC / HPF 6-30 0 - 5 RBC/hpf   WBC, UA 0-5 0 - 5 WBC/hpf    Bacteria, UA NONE SEEN NONE SEEN   Squamous Epithelial / LPF 0-5 (A) NONE SEEN      Assessment & Plan:   Problem List Items Addressed This Visit    Urge incontinence of urine    Persistent problem, now recent worsening per caregivers with more limited mobility, unable to make it to bathroom - Does not seem to have spontaneous accidents during day, not indicative of neurological or other complication, seems problem is ambulating to bathroom  Plan: 1. Printed order for 3n1 bedside commode - take to med supply store, follow-up with this as needed 2. Advised follow-up with Palliative for contact hospice for possible [facility] services, or can notify our office to arrange [facility] if needed      Relevant Orders   For home use only DME 3 n 1   Palliative care patient   Incontinence of feces with fecal urgency - Primary    Persistent problem, now recent worsening per caregivers with more limited mobility, unable to make it to bathroom - Does not seem to have spontaneous accidents during day, not indicative of neurological or other complication, seems problem is ambulating to bathroom  Plan: 1. Printed order for 3n1 bedside commode - take to med supply store, follow-up with this as needed 2. Advised follow-up with Palliative for contact hospice for possible [facility] services, or can notify our office to arrange [facility] if needed      Relevant Orders   For home use only DME 3 n 1   End stage COPD (HCC)    Stable end stage severe COPD with chronic resp failure requiring supplemental O2, scheduled nebulizers - Followed by [facility], managing chronic pred, breathing treatments, O2 - Discharged from hospice on 03/22/16 due to clinical improvement  Plan: 1. Continue chronic prednisone per pulm, now on 15mg  daily instead of 20mg  daily (since 03/28/16) 2. Continue current nebs/inhalers - per chart review pending PA for Brovana nebulizer, patient family asking on status, advised to check with Pulmonology,  pending as of 05/03/16 3. Continues with supplemental O2 per Pulm 4. Follow-up as needed, as discussed if concerns with inc sputum production, dyspnea, fevers, can return, consider CXR, if mild sputum production may consider azithromycin z-pak if needed, as already on prednisone, but may benefit from Pred burst temporarily      Chronic right-sided back pain    Stable, recent minor fall from seated 4 weeks ago without obvious injury. Suspect some  baseline chronic MSK pain with osteoarthritis and cachectic thin appearance overall, susceptible to minor MSK injuries - Remains off Hydrocodone  Plan: 1. Refilled Baclofen today - 10mg  tabs, may continue current 0.5mg  tabs BID regularly, can use additional as advised if worse pain, likely safest option and seems to be working well, counseling on avoid opiates or stronger meds due to sedation / fall risk 2. Continue conservative Tylenol / Heating pad 3. Follow-up as needed      Relevant Medications   baclofen (LIORESAL) 10 MG tablet   Chronic respiratory failure with hypoxia, on home oxygen therapy (HCC)    Stable end stage severe COPD with chronic resp failure requiring supplemental O2, scheduled nebulizers - Followed by [facility], managing chronic pred, breathing treatments, O2      BPH associated with nocturia    Likely contributing to chronic urinary symptoms with some urinary incontinence as well, in addition to nocturia LUTS - Continue flomax given concern of potential urinary obstruction if not voiding - See A&P incontinence, ordered home DME 3n1 bedside commode         Meds ordered this encounter  Medications  . baclofen (LIORESAL) 10 MG tablet    Sig: Take 0.5-1 tablets (5-10 mg total) by mouth 3 (three) times daily as needed for muscle spasms.    Dispense:  60 each    Refill:  5    Follow up plan: Return in about 3 months (around 08/07/2016) for back pain / COPD.  Saralyn PilarAlexander Tranika Scholler, DO [facility] 05/07/2016, 10:17 PM

## 2016-05-07 NOTE — Assessment & Plan Note (Signed)
Persistent problem, now recent worsening per caregivers with more limited mobility, unable to make it to bathroom - Does not seem to have spontaneous accidents during day, not indicative of neurological or other complication, seems problem is ambulating to bathroom  Plan: 1. Printed order for 3n1 bedside commode - take to med supply store, follow-up with this as needed 2. Advised follow-up with Palliative for contact hosp[facility], or can notify our office [facility] if needed

## 2016-05-08 NOTE — Telephone Encounter (Signed)
finally got pt to understand he can't get the [facility] unless he pays the $100 for it. Informed pt that he needs to do Pulmicort and the albuterol 3-4 times daily as needed. Pt verbalized understanding. Nothing further needed.

## 2016-05-08 NOTE — Telephone Encounter (Signed)
If he can't afford it, he may use albuterol nebulized 3 or 4 times a day as an alternative  Theodoro Gristave

## 2016-05-14 ENCOUNTER — Ambulatory Visit: Payer: Medicare Other | Admitting: Pulmonary Disease

## 2016-05-20 ENCOUNTER — Encounter: Payer: Self-pay | Admitting: Pulmonary Disease

## 2016-05-20 ENCOUNTER — Telehealth: Payer: Self-pay | Admitting: Pulmonary Disease

## 2016-05-20 ENCOUNTER — Ambulatory Visit (INDEPENDENT_AMBULATORY_CARE_PROVIDER_SITE_OTHER): Payer: Medicare Other | Admitting: Pulmonary Disease

## 2016-05-20 VITALS — BP 114/72 | HR 91 | Wt 125.0 lb

## 2016-05-20 DIAGNOSIS — J449 Chronic obstructive pulmonary disease, unspecified: Secondary | ICD-10-CM | POA: Diagnosis not present

## 2016-05-20 DIAGNOSIS — J9611 Chronic respiratory failure with hypoxia: Secondary | ICD-10-CM

## 2016-05-20 MED ORDER — AZITHROMYCIN 250 MG PO TABS: ORAL_TABLET | ORAL | 0 refills | Status: AC

## 2016-05-20 MED ORDER — FLUTICASONE-UMECLIDIN-VILANT 100-62.5-25 MCG/INH IN AEPB: 1.0000 | INHALATION_SPRAY | Freq: Every day | RESPIRATORY_TRACT | 10 refills | Status: AC

## 2016-05-20 NOTE — Telephone Encounter (Signed)
Patient said someone called today.  Please call again

## 2016-05-20 NOTE — Telephone Encounter (Signed)
Informed pt we didn't call. Not sure who called/ nothing further needed.

## 2016-05-20 NOTE — Patient Instructions (Signed)
Stop Pulmicort nebulizer  Stop Brovana nebulizer (you never got this medication anyway)  Trelegy inhaler (this is 3 medications in one) - one inhalation daily in the morning  Continue Combivent inhaler as needed  Continue on prednisone 15-20 mg daily as you doing  Z pak ordered  Follow up in 3 months or sooner as needed

## 2016-05-20 NOTE — Progress Notes (Signed)
PULMONARY OFFICE FOLLOW UP NOTE  PROBLEMS:  Severe COPD - Previously on Hospice Chronic hypoxemic respiratory failure Former smoker - quit 2015  DATA:   INTERVAL HISTORY: No major events  SUBJ: This is a routine reevaluation. Mr. I was last seen on January 25. At that time we discussed reducing his dose of prednisone from 20 mg per day to 15 mg per day. I also prescribed nebulized budesonide and arformoterol. His insurance company would not pay for the arformoterol. He has been using the nebulized budesonide. He does not notice any change in his symptoms overall. He is using prednisone 15 mg per day but sometimes takes a full 20 mg when he feels increased shortness of breath. Over the last couple of days he's had increased chest congestion and scant green mucus. He denies chest pain and hemoptysis.  OBJ: Vitals:   05/20/16 1130  BP: 114/72  Pulse: 91  SpO2: 94%  Weight: 125 lb (56.7 kg)  RA  Gen: NAD HEENT: All WNL Neck: NO LAN, no JVD noted Lungs: Markedly diminished BS without wheezes or rhonchi Cardiovascular: Reg rate, normal rhythm, no M noted Abdomen: Soft, NT +BS Ext: no C/C/E Neuro: grossly intact   DATA: No recent CXR, PFTs  IMPRESSION: 1) very severe COPD 2) chronic hypoxic respiratory failure 3) suspect early phase acute exacerbation COPD 4) unable to obtain nebulized arformoterol due to payer issues  PLAN: 1) because of the payer issues, we will go back to inhalers as his controller medications - Advair 250/50 twice a day, Spiriva HandiHaler 1 inhalation daily 2) continue Combivent inhaler as needed 3) continue oxygen therapy with exertion and sleep 4) continue prednisone at 15 mg per day 5) Z-Pak ordered for mild acute exacerbation 6) follow-up in 3 months or as needed   Connor Fischeravid Faris Lewis, Connor Lewis PCCM service Mobile [phone] Pager [phone] 05/20/2016

## 2016-08-06 ENCOUNTER — Ambulatory Visit: Payer: Medicare Other | Admitting: Family Medicine

## 2016-08-06 ENCOUNTER — Other Ambulatory Visit: Payer: Self-pay | Admitting: Family Medicine

## 2016-08-06 ENCOUNTER — Ambulatory Visit: Payer: Medicare Other

## 2016-08-06 DIAGNOSIS — G8929 Other chronic pain: Secondary | ICD-10-CM

## 2016-08-06 DIAGNOSIS — M546 Pain in thoracic spine: Principal | ICD-10-CM

## 2016-08-12 ENCOUNTER — Telehealth: Payer: Self-pay

## 2016-08-12 ENCOUNTER — Other Ambulatory Visit: Payer: Self-pay | Admitting: Urology

## 2016-08-12 NOTE — Telephone Encounter (Signed)
08/10/16 [employer] fax:  Pt unable to empty bladder. RN advised pt to go to ED immediately.   Spoke to patient. Symptoms have since resolved. He is urinating normally. Pt says he forgot to take his urology meds and he has been in bed due to back problems so he believes that was the cause.

## 2016-08-20 ENCOUNTER — Telehealth: Payer: Self-pay | Admitting: Pulmonary Disease

## 2016-08-20 NOTE — Telephone Encounter (Signed)
02 renewal paperwork is in folder for completion.

## 2016-08-20 NOTE — Telephone Encounter (Signed)
Oxygen renewal form was faxed on 5/23 and 6/13, calling to check status. Please call and advise.

## 2016-09-09 ENCOUNTER — Other Ambulatory Visit: Payer: Self-pay | Admitting: Family Medicine

## 2016-09-09 ENCOUNTER — Other Ambulatory Visit: Payer: Self-pay | Admitting: Pulmonary Disease

## 2016-09-10 ENCOUNTER — Telehealth: Payer: Self-pay | Admitting: *Deleted

## 2016-09-10 NOTE — Telephone Encounter (Signed)
Received 3rd request for documentation of service date 05/08/16 from [facility] for Pulmicort. Last office visit was faxed to [phone], confirmation received.

## 2016-09-13 ENCOUNTER — Ambulatory Visit (INDEPENDENT_AMBULATORY_CARE_PROVIDER_SITE_OTHER): Payer: Medicare Other | Admitting: Family Medicine

## 2016-09-13 ENCOUNTER — Encounter: Payer: Self-pay | Admitting: Family Medicine

## 2016-09-13 VITALS — BP 111/79 | HR 90 | Temp 97.6°F | Resp 16 | Ht 70.0 in | Wt 119.0 lb

## 2016-09-13 DIAGNOSIS — J449 Chronic obstructive pulmonary disease, unspecified: Secondary | ICD-10-CM

## 2016-09-13 DIAGNOSIS — F5101 Primary insomnia: Secondary | ICD-10-CM | POA: Diagnosis not present

## 2016-09-13 DIAGNOSIS — J3089 Other allergic rhinitis: Secondary | ICD-10-CM | POA: Diagnosis not present

## 2016-09-13 DIAGNOSIS — N401 Enlarged prostate with lower urinary tract symptoms: Secondary | ICD-10-CM

## 2016-09-13 DIAGNOSIS — Z9981 Dependence on supplemental oxygen: Secondary | ICD-10-CM

## 2016-09-13 DIAGNOSIS — Z515 Encounter for palliative care: Secondary | ICD-10-CM | POA: Diagnosis not present

## 2016-09-13 DIAGNOSIS — N138 Other obstructive and reflux uropathy: Secondary | ICD-10-CM

## 2016-09-13 DIAGNOSIS — J9611 Chronic respiratory failure with hypoxia: Secondary | ICD-10-CM | POA: Diagnosis not present

## 2016-09-13 DIAGNOSIS — R636 Underweight: Secondary | ICD-10-CM | POA: Diagnosis not present

## 2016-09-13 DIAGNOSIS — F419 Anxiety disorder, unspecified: Secondary | ICD-10-CM

## 2016-09-13 MED ORDER — LORAZEPAM 1 MG PO TABS: 0.5000 mg | ORAL_TABLET | Freq: Three times a day (TID) | ORAL | 2 refills | Status: AC | PRN

## 2016-09-13 MED ORDER — IPRATROPIUM BROMIDE 0.06 % NA SOLN: 2.0000 | Freq: Four times a day (QID) | NASAL | 2 refills | Status: AC

## 2016-09-13 NOTE — Assessment & Plan Note (Signed)
Stable currently Continue Flomax, Finasteride Recommend return to [facility] as planned, last visit 09/2015, due for follow-up

## 2016-09-13 NOTE — Assessment & Plan Note (Signed)
Secondary to significant chronic illness end stage COPD - Remains improved on lorazepam, after started during hospice - Reviewed [place] CSRS for 1 year, appropriate rx, has 1 refill left on current Lorazepam, 3 month supply lasted >4 months likely up to 6 months  Plan: 1. Refilled Lorazepam 1mg  tabs, take half tab 0.5mg  dose TID, #45 tabs with +2 refills printed given to patient today - counseled on risks / side effects dependence, caution fall risk if taking too much 2. Continue other management for now 3. Follow-up

## 2016-09-13 NOTE — Assessment & Plan Note (Addendum)
Without flare today. Stable end stage severe COPD with chronic resp failure requiring supplemental O2, scheduled nebulizers - Followed by [facility], managing chronic pred, breathing treatments, O2 - Discharged from hospice on 03/22/16 due to clinical improvement  Plan: 1. No change to pulm treatment. Today with sputum inc but no dyspnea, O2 change or infectious symptoms - symptom management, continue Guaifenesin syrup, and routine management 2. Follow-up as needed and [facility] anytime in 1 month, as discussed if concerns with inc sputum production, dyspnea, fevers, can return, consider CXR, if mild sputum production may consider azithromycin z-pak if needed, as already on prednisone, but may benefit from Pred burst temporarily

## 2016-09-13 NOTE — Patient Instructions (Addendum)
Thank you for coming to the clinic today.  1.  Look at medicine list - check for Mirtazapine (Remeron) 15mg  bedtime - for sleep and mainly can be used for appetite stimulation.  Notify us of which med you have and the dose, we may try a higher dose after 2-4 weeks if not helping  Try to find different foods that you like and can eat more regularly.  Continue Ensure or Boost, may need to increase this if weight drops more.  2. Follow-up with Dr Sung AmabileSimonds for lungs.  Refilled Lorazepam.  Notify office if you  Need a refill on Baclofen  Start Atrovent nasal spray decongestant 2 sprays in each nostril up to 4 times daily for 7 days   If phlegm gets worse, coughing more, fever, thicker productive cough, difficulty breathing, notify office if you need an antibiotic or burst of prednisone before you can see your Lung doctor.  Last appointment with Urology was July 2017, you are due to return. They are prescribing Finasteride for your prostate. You have enough to last through September, otherwise need to see them for refills.  [facility] -1st floor [address] [place],  [place]  [id number] Phone: [phone]  Vanna ScotlandAshley Brandon, MD Michiel CowboyShannon McGowan, PA  Please schedule a Follow-up Appointment to: Return in about 3 months (around 12/14/2016) for COPD, Weight Check / Appetite.  If you have any other questions or concerns, please feel free to call the clinic or send a message through MyChart. You may also schedule an earlier appointment if necessary.  Additionally, you may be receiving a survey about your experience at our clinic within a few days to 1 week by e-mail or mail. We value your feedback.  Saralyn PilarAlexander Lynnae Ludemann, DO [facility]

## 2016-09-13 NOTE — Progress Notes (Signed)
Subjective:    Patient ID: Connor Lewis, male    DOB: 1940-08-18, 76 y.o.   MRN: [id number]  Connor Lewis is a 76 y.o. male presenting on 09/13/2016 for Nasal Congestion (onset last week had refill from hospice and need more refill for cough syrup)  History primarily provided by patient, additional history from patient's daughter Zannie Cove Mabe)  HPI   Nasal Congestion / Productive Cough: - See COPD below for more details - Reports more recent problem over past 2 weeks, with some phlegm with mostly clear sometimes thicker white, difficulty with productive cough and trying to clear phlegm. Improved with Siltussin (guaifenesin) cough syrup recently, rarely it will affect his breathing, normally has some dyspnea, but he will have a spell of difficulty clearing phlegm affecting his breathing, with some increased work of breathing from time to time. Usually best time is early in morning and late in afternoon breathing seems to be best. Otherwise he can have periodic episodes of difficulty breathing. - He plans to follow-up with Dr Sung Amabile as scheduled since about 3-4 months since last visit. - Denies any fevers/chills, nausea, vomiting, thicker purulent sputum, sinus pain or headache, ear pain or pressure  FOLLOW-UP MID / LOW BACK PAIN, Chronic - Previously followed for acute on chronic back pain flares. Last 03/2016 Improved on Baclofen. In past hospice gave hydrocodone, has been off of this for months. - Today reports doing well, but did have recent back pain flare about 6 weeks ago, at home he did not fall but had a back spasm that caused pain, required more bed rest and tried to keep active, improved again on Baclofen and Gabapentin - Currently takes half tab Baclofen 10mg  tab about 2 times daily - Takes Tylenol regularly for pain - Denies any numbness, tingling, weakness  BPH / Mixed Urinary Incontinence: Followed by [facility] in past, last visit Michiel Cowboy 09/2015, he has been rx'd  Finasteride by them and monitoring voiding PVR etc. He has not been back since, had one flare in past 1-2 months of urinary difficulty attributed to back pain but them improved. - He would like to return for follow-up to Urology  Palliative Care Patient / End-Stage COPD / Chronic Respiratory Failure / Poor Appetite Weight Loss - Prior history followed [facility] since end of summer early Fall 2017, approx total time >4 months, diagnosis with End-Stage COPD and chronic respiratory failure had declined functional status following hospitalization. Overall he has progressed very well on hospice and discharged on 03/22/16 due to clinical improvement. - Continues to receive Palliative Care and [facility] management, continues supplemental oxygen - Pulm has changed treatment to Trelegy ellipta, with some improvement, he will discuss this further with them, continues on nebulizer treatments and chronic Prednisone 15mg  daily (x 3 of 5mg  tabs) - Today admits some recent weight loss about 6 lbs in 4 months. Gradual. Admits poor appetite often, some foods he likes to eat, otherwise "do not taste like they used to", less desire to eat, taking Boost/Ensure supplement daily, but can increase this. - Previously was on Mirtazapine 15mg  nightly for insomnia, but taking other meds as well couldn't tell if this helped, unsure if helped weight, has some at home will re-try - Due for refill on Lorazepam. Still taking Lorazepam 0.5mg  TID PRN - last refilled 1/22 with 3 month supply, patient states not taking every day but helps anxiety and breathing difficulties. He denies any sedation, falls or problems  with tremors or withdrawal on this, he took it more often most every day for few weeks about a month ago with back pain. He still has 1 refill left. - Denies any new worsening chest pain, dyspnea, pain, productive cough, fevers/chills   Social History  Substance Use  Topics  . Smoking status: Former Smoker    Packs/day: 1.00    Years: 56.00  . Smokeless tobacco: Former Neurosurgeon     Comment: quit in 2015  . Alcohol use 0.0 oz/week     Comment: occ    Review of Systems Per HPI unless specifically indicated above     Objective:    BP 111/79   Pulse 90   Temp 97.6 F (36.4 C) (Oral)   Resp 16   Ht 5\' 10"  (1.778 m)   Wt 119 lb (54 kg)   SpO2 98%   BMI 17.07 kg/m   Wt Readings from Last 3 Encounters:  09/13/16 119 lb (54 kg)  05/20/16 125 lb (56.7 kg)  05/07/16 124 lb 9.6 oz (56.5 kg)    Physical Exam  Constitutional: He is oriented to person, place, and time. He appears well-developed and well-nourished. No distress.  Chronically ill and frail but currently well appearing, elderly 21 male, cachetic, comfortable, cooperative.  HENT:  Head: Normocephalic and atraumatic.  Moist mucus mem  Portable oxygen, via [place]  Frontal / maxillary sinuses non-tender. Nares with slight turbinate edema without purulence or edema. Bilateral TMs partially obscured by dry cerumen,otherwise clear without erythema, effusion or bulging. Oropharynx clear without erythema, exudates, edema or asymmetry.  Eyes: Pupils are equal, round, and reactive to light. Conjunctivae and EOM are normal. Right eye exhibits no discharge. Left eye exhibits no discharge.  Cardiovascular: Normal rate, regular rhythm, normal heart sounds and intact distal pulses.   No murmur heard. Pulmonary/Chest: Effort normal. No respiratory distress. He has no wheezes. He has no rales. He exhibits no tenderness.  Unchanged breath sounds with chronic lung disease.  Persistent baseline diffuse reduced air movement. No focal crackles or coarse breath sounds. No coughing. Speaks full sentences.  Musculoskeletal: He exhibits no edema.  Mid / Low Back Inspection: Thin body habitus with muscle wasting. No spinal deformity. Palpation: No tenderness over spinous processes. Mild persistent similar tenderness  bilateral side thoracic paraspinal muscles, near scapular region  Neurological: He is alert and oriented to person, place, and time.  Skin: Skin is warm and dry. No rash noted. He is not diaphoretic.  Psychiatric: He has a normal mood and affect. His behavior is normal.  Well groomed, good eye contact, normal speech and thoughts  Nursing note and vitals reviewed.   I have personally reviewed the following lab results from 09/2015.  Results for orders placed or performed during the hospital encounter of 09/27/15  Basic metabolic panel  Result Value Ref Range   Sodium 136 135 - 145 mmol/L   Potassium 3.2 (L) 3.5 - 5.1 mmol/L   Chloride 98 (L) 101 - 111 mmol/L   CO2 27 22 - 32 mmol/L   Glucose, Bld 252 (H) 65 - 99 mg/dL   BUN 16 6 - 20 mg/dL   Creatinine, Ser 1.61 0.61 - 1.24 mg/dL   Calcium 9.5 8.9 - 09.6 mg/dL   GFR calc non Af Amer 58 (L) >60 mL/min   GFR calc Af Amer >60 >60 mL/min   Anion gap 11 5 - 15  CBC  Result Value Ref Range   WBC 12.4 (H) 3.8 - 10.6  K/uL   RBC 5.14 4.40 - 5.90 MIL/uL   Hemoglobin 14.7 13.0 - 18.0 g/dL   HCT 63.8 75.6 - 43.3 %   MCV 84.8 80.0 - 100.0 fL   MCH 28.6 26.0 - 34.0 pg   MCHC 33.7 32.0 - 36.0 g/dL   RDW 29.5 18.8 - 41.6 %   Platelets 224 150 - 440 K/uL  Troponin I  Result Value Ref Range   Troponin I 0.03 (HH) <0.03 ng/mL  Urinalysis complete, with microscopic ([facility] only)  Result Value Ref Range   Color, Urine YELLOW (A) YELLOW   APPearance CLEAR (A) CLEAR   Glucose, UA 150 (A) NEGATIVE mg/dL   Bilirubin Urine NEGATIVE NEGATIVE   Ketones, ur NEGATIVE NEGATIVE mg/dL   Specific Gravity, Urine 1.014 1.005 - 1.030   Hgb urine dipstick 1+ (A) NEGATIVE   pH 6.0 5.0 - 8.0   Protein, ur 100 (A) NEGATIVE mg/dL   Nitrite NEGATIVE NEGATIVE   Leukocytes, UA NEGATIVE NEGATIVE   RBC / HPF 6-30 0 - 5 RBC/hpf   WBC, UA 0-5 0 - 5 WBC/hpf   Bacteria, UA NONE SEEN NONE SEEN   Squamous Epithelial / LPF 0-5 (A) NONE SEEN      Assessment & Plan:    Problem List Items Addressed This Visit    Patient underweight    Primarily poor appetite, among chronic illness with BMI now 17s Recent weight loss. Improve nutrition if able, inc protein meal supplement Discussed resuming med - Mirtazapine, has at home believes, 15mg  resume nightly, may need titrate dose up for appetite stim      Palliative care patient   Insomnia    Insomnia is improved Refilled Lorazepam Failed Mirtazapine in past, but may resume this for appetite stim      Relevant Medications   LORazepam (ATIVAN) 1 MG tablet   End stage COPD (HCC) - Primary    Without flare today. Stable end stage severe COPD with chronic resp failure requiring supplemental O2, scheduled nebulizers - Followed by [facility], managing chronic pred, breathing treatments, O2 - Discharged from hospice on 03/22/16 due to clinical improvement  Plan: 1. No change to pulm treatment. Today with sputum inc but no dyspnea, O2 change or infectious symptoms - symptom management, continue Guaifenesin syrup, and routine management 2. Follow-up as needed and return to [facility] as scheduled anytime in 1 month, as discussed if concerns with inc sputum production, dyspnea, fevers, can return, consider CXR, if mild sputum production may consider azithromycin z-pak if needed, as already on prednisone, but may benefit from Pred burst temporarily      Relevant Medications   LORazepam (ATIVAN) 1 MG tablet   ipratropium (ATROVENT) 0.06 % nasal spray   Chronic respiratory failure with hypoxia, on home oxygen therapy (HCC)    Stable end stage severe COPD with chronic resp failure requiring supplemental O2, scheduled nebulizers - Followed by [facility], managing chronic pred, breathing treatments, O2      BPH with obstruction/lower urinary tract symptoms    Stable currently Continue Flomax, Finasteride Recommend return to [facility] as planned, last visit 09/2015, due for follow-up      Anxiety     Secondary to significant chronic illness end stage COPD - Remains improved on lorazepam, after started during hospice - Reviewed [place] CSRS for 1 year, appropriate rx, has 1 refill left on current Lorazepam, 3 month supply lasted >4 months likely up to 6 months  Plan: 1. Refilled Lorazepam 1mg  tabs, take  half tab 0.5mg  dose TID, #45 tabs with +2 refills printed given to patient today - counseled on risks / side effects dependence, caution fall risk if taking too much 2. Continue other management for now 3. Follow-up      Relevant Medications   LORazepam (ATIVAN) 1 MG tablet   Allergic rhinitis    Continue Flonase Start Atrovent nasal spray decongestant 2 sprays in each nostril up to 4 times daily for 7 days for PRN use      Relevant Medications   ipratropium (ATROVENT) 0.06 % nasal spray      Meds ordered this encounter  Medications  . LORazepam (ATIVAN) 1 MG tablet    Sig: Take 0.5-1 tablets (0.5-1 mg total) by mouth every 8 (eight) hours as needed for anxiety or sleep.    Dispense:  45 tablet    Refill:  2  . ipratropium (ATROVENT) 0.06 % nasal spray    Sig: Place 2 sprays into both nostrils 4 (four) times daily. For up to 5-7 days then stop.    Dispense:  15 mL    Refill:  2    Follow up plan: Return in about 3 months (around 12/14/2016) for COPD, Weight Check / Appetite.  Saralyn PilarAlexander Karamalegos, DO [facility] 05/07/2016, 10:17 PM

## 2016-09-13 NOTE — Assessment & Plan Note (Signed)
Primarily poor appetite, among chronic illness with BMI now 17s Recent weight loss. Improve nutrition if able, inc protein meal supplement Discussed resuming med - Mirtazapine, has at home believes, 15mg  resume nightly, may need titrate dose up for appetite stim

## 2016-09-13 NOTE — Assessment & Plan Note (Signed)
Stable end stage severe COPD with chronic resp failure requiring supplemental O2, scheduled nebulizers - Followed by [facility], managing chronic pred, breathing treatments, O2

## 2016-09-13 NOTE — Assessment & Plan Note (Signed)
Insomnia is improved Refilled Lorazepam Failed Mirtazapine in past, but may resume this for appetite stim

## 2016-09-13 NOTE — Assessment & Plan Note (Signed)
Continue Flonase Start Atrovent nasal spray decongestant 2 sprays in each nostril up to 4 times daily for 7 days for PRN use

## 2016-10-03 ENCOUNTER — Telehealth: Payer: Self-pay | Admitting: Family Medicine

## 2016-10-03 DIAGNOSIS — J449 Chronic obstructive pulmonary disease, unspecified: Secondary | ICD-10-CM

## 2016-10-03 MED ORDER — AZITHROMYCIN 250 MG PO TABS: ORAL_TABLET | ORAL | 0 refills | Status: DC

## 2016-10-03 NOTE — Telephone Encounter (Signed)
Pt. Daughter called wanting to know if you would call in another zpac. Alisha call back # is [phone].

## 2016-10-03 NOTE — Telephone Encounter (Signed)
Patient's daughter called back, spoke with her, switched med to [facility] by request, [facility]. Follow-up as needed  Saralyn PilarAlexander Karamalegos, DO [facility] 10/03/2016, 12:03 PM

## 2016-10-03 NOTE — Telephone Encounter (Signed)
Last seen by me 09/13/16, see note for details, discussed chronic COPD, already on chronic prednisone, had persistent symptoms but not consistent with AECOPD. Advised to notify office if worsening and could send in Z-pak antibiotic course, agree to send rx, attempted to call did not reach them, if calls back notify that [facility] was [facility], and if worsening symptoms despite antibiotic, should follow-up with myself or Pulmonology for further management. Or [facility]  Saralyn PilarAlexander Karamalegos, DO [facility] 10/03/2016, 11:54 AM

## 2016-10-11 ENCOUNTER — Encounter (HOSPITAL_COMMUNITY): Payer: Self-pay

## 2016-10-11 ENCOUNTER — Inpatient Hospital Stay (HOSPITAL_COMMUNITY)
Admission: EM | Admit: 2016-10-11 | Discharge: 2016-10-13 | DRG: 092 | Disposition: A | Discharge status: Home Health | Payer: Medicare Other | Attending: Family Medicine | Admitting: Family Medicine

## 2016-10-11 ENCOUNTER — Emergency Department (HOSPITAL_COMMUNITY): Payer: Medicare Other

## 2016-10-11 DIAGNOSIS — G629 Polyneuropathy, unspecified: Secondary | ICD-10-CM | POA: Diagnosis present

## 2016-10-11 DIAGNOSIS — G8929 Other chronic pain: Secondary | ICD-10-CM | POA: Diagnosis present

## 2016-10-11 DIAGNOSIS — J9811 Atelectasis: Secondary | ICD-10-CM | POA: Diagnosis not present

## 2016-10-11 DIAGNOSIS — N138 Other obstructive and reflux uropathy: Secondary | ICD-10-CM | POA: Diagnosis not present

## 2016-10-11 DIAGNOSIS — R339 Retention of urine, unspecified: Secondary | ICD-10-CM | POA: Diagnosis present

## 2016-10-11 DIAGNOSIS — R52 Pain, unspecified: Secondary | ICD-10-CM | POA: Diagnosis not present

## 2016-10-11 DIAGNOSIS — G92 Toxic encephalopathy: Principal | ICD-10-CM | POA: Diagnosis present

## 2016-10-11 DIAGNOSIS — R338 Other retention of urine: Secondary | ICD-10-CM | POA: Diagnosis not present

## 2016-10-11 DIAGNOSIS — I10 Essential (primary) hypertension: Secondary | ICD-10-CM | POA: Diagnosis present

## 2016-10-11 DIAGNOSIS — Z882 Allergy status to sulfonamides status: Secondary | ICD-10-CM

## 2016-10-11 DIAGNOSIS — J9611 Chronic respiratory failure with hypoxia: Secondary | ICD-10-CM | POA: Diagnosis present

## 2016-10-11 DIAGNOSIS — Z87891 Personal history of nicotine dependence: Secondary | ICD-10-CM

## 2016-10-11 DIAGNOSIS — J9612 Chronic respiratory failure with hypercapnia: Secondary | ICD-10-CM | POA: Diagnosis present

## 2016-10-11 DIAGNOSIS — F419 Anxiety disorder, unspecified: Secondary | ICD-10-CM | POA: Diagnosis present

## 2016-10-11 DIAGNOSIS — J449 Chronic obstructive pulmonary disease, unspecified: Secondary | ICD-10-CM | POA: Diagnosis present

## 2016-10-11 DIAGNOSIS — N401 Enlarged prostate with lower urinary tract symptoms: Secondary | ICD-10-CM | POA: Diagnosis present

## 2016-10-11 DIAGNOSIS — Z881 Allergy status to other antibiotic agents status: Secondary | ICD-10-CM | POA: Diagnosis not present

## 2016-10-11 DIAGNOSIS — Z7952 Long term (current) use of systemic steroids: Secondary | ICD-10-CM

## 2016-10-11 DIAGNOSIS — B370 Candidal stomatitis: Secondary | ICD-10-CM

## 2016-10-11 DIAGNOSIS — G9340 Encephalopathy, unspecified: Secondary | ICD-10-CM | POA: Diagnosis present

## 2016-10-11 DIAGNOSIS — Z79899 Other long term (current) drug therapy: Secondary | ICD-10-CM

## 2016-10-11 DIAGNOSIS — Z9981 Dependence on supplemental oxygen: Secondary | ICD-10-CM | POA: Diagnosis not present

## 2016-10-11 DIAGNOSIS — R300 Dysuria: Secondary | ICD-10-CM | POA: Diagnosis not present

## 2016-10-11 LAB — CBC WITH DIFFERENTIAL/PLATELET
Basophils Absolute: 0 10*3/uL (ref 0.0–0.1)
Basophils Relative: 0 %
EOS ABS: 0 10*3/uL (ref 0.0–0.7)
EOS PCT: 0 %
HCT: 44.8 % (ref 39.0–52.0)
Hemoglobin: 15 g/dL (ref 13.0–17.0)
LYMPHS ABS: 0.3 10*3/uL — AB (ref 0.7–4.0)
Lymphocytes Relative: 2 %
MCH: 29.8 pg (ref 26.0–34.0)
MCHC: 33.5 g/dL (ref 30.0–36.0)
MCV: 89.1 fL (ref 78.0–100.0)
MONO ABS: 0.7 10*3/uL (ref 0.1–1.0)
MONOS PCT: 5 %
Neutro Abs: 11.8 10*3/uL — ABNORMAL HIGH (ref 1.7–7.7)
Neutrophils Relative %: 93 %
PLATELETS: 265 10*3/uL (ref 150–400)
RBC: 5.03 MIL/uL (ref 4.22–5.81)
RDW: 13.3 % (ref 11.5–15.5)
WBC: 12.7 10*3/uL — ABNORMAL HIGH (ref 4.0–10.5)

## 2016-10-11 LAB — I-STAT TROPONIN, ED: TROPONIN I, POC: 0.02 ng/mL (ref 0.00–0.08)

## 2016-10-11 LAB — BLOOD GAS, ARTERIAL
ACID-BASE EXCESS: 8 mmol/L — AB (ref 0.0–2.0)
BICARBONATE: 33.2 mmol/L — AB (ref 20.0–28.0)
Drawn by: 418751
O2 Content: 3 L/min
O2 Saturation: 95.4 %
PH ART: 7.435 (ref 7.350–7.450)
PO2 ART: 77.9 mmHg — AB (ref 83.0–108.0)
Patient temperature: 98.6
pCO2 arterial: 50.3 mmHg — ABNORMAL HIGH (ref 32.0–48.0)

## 2016-10-11 LAB — URINALYSIS, ROUTINE W REFLEX MICROSCOPIC
BILIRUBIN URINE: NEGATIVE
Glucose, UA: NEGATIVE mg/dL
KETONES UR: 20 mg/dL — AB
Leukocytes, UA: NEGATIVE
Nitrite: NEGATIVE
PROTEIN: NEGATIVE mg/dL
SPECIFIC GRAVITY, URINE: 1.013 (ref 1.005–1.030)
SQUAMOUS EPITHELIAL / LPF: NONE SEEN
pH: 6 (ref 5.0–8.0)

## 2016-10-11 LAB — COMPREHENSIVE METABOLIC PANEL
ALT: 14 U/L — AB (ref 17–63)
AST: 21 U/L (ref 15–41)
Albumin: 3.9 g/dL (ref 3.5–5.0)
Alkaline Phosphatase: 70 U/L (ref 38–126)
Anion gap: 13 (ref 5–15)
BUN: 21 mg/dL — AB (ref 6–20)
CHLORIDE: 90 mmol/L — AB (ref 101–111)
CO2: 33 mmol/L — AB (ref 22–32)
CREATININE: 1.1 mg/dL (ref 0.61–1.24)
Calcium: 10.3 mg/dL (ref 8.9–10.3)
GFR calc Af Amer: >60 mL/min (ref >60)
GFR calc non Af Amer: >60 mL/min (ref >60)
GLUCOSE: 106 mg/dL — AB (ref 65–99)
Potassium: 4.7 mmol/L (ref 3.5–5.1)
Sodium: 136 mmol/L (ref 135–145)
Total Bilirubin: 1 mg/dL (ref 0.3–1.2)
Total Protein: 7.3 g/dL (ref 6.5–8.1)

## 2016-10-11 LAB — I-STAT CG4 LACTIC ACID, ED
LACTIC ACID, VENOUS: 1.59 mmol/L (ref 0.5–1.9)
Lactic Acid, Venous: 0.94 mmol/L (ref 0.5–1.9)

## 2016-10-11 MED ORDER — FLUCONAZOLE 150 MG PO TABS
150.0000 mg | ORAL_TABLET | Freq: Once | ORAL | Status: AC
  Administered 2016-10-11: 150 mg via ORAL
  Filled 2016-10-11: qty 1

## 2016-10-11 MED ORDER — SODIUM CHLORIDE 0.9 % IV BOLUS (SEPSIS)
500.0000 mL | Freq: Once | INTRAVENOUS | Status: AC
  Administered 2016-10-11: 500 mL via INTRAVENOUS

## 2016-10-11 NOTE — ED Triage Notes (Signed)
Patient brought in by EMS for c/o urinary frequency and UTI. Per report, patient family states he is being treated for UTI and URI. Patient has history of end-stage COPD. Patient lives on [address] at home. Patient urinates approximately 5mL at a time. Patient requesting a foley catheter.

## 2016-10-11 NOTE — ED Notes (Signed)
EDMD and EDPA at bedside, inserting foley catheter at this time.

## 2016-10-11 NOTE — ED Provider Notes (Signed)
[facility]-EMERGENCY DEPT Provider Note   CSN: [id number] Arrival date & time: 10/11/16  1732     History   Chief Complaint Chief Complaint  Patient presents with  . Urinary Frequency    HPI Connor Lewis is a 76 y.o. male with acute with urinary retention. There is a level V caveat. The patient is confused. I'm unsure if this is his baseline. He has a past medical history of COPD and is on3 L via nasal cannula. The patient is unable to express his symptoms, except that he feels extremely uncomfortable. He is oriented to self only. He states that he hurts, but is unable to express where his pain is.  HPI  Past Medical History:  Diagnosis Date  . Anxiety and depression   . Chronic bronchiolitis (HCC)   . 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 Diverticulitis   . Urinary retention   . Urine incontinence     Patient Active Problem List   Diagnosis Date Noted  . Urge incontinence of urine 05/07/2016  . Incontinence of feces with fecal urgency 05/07/2016  . Anxiety 03/25/2016  . Insomnia 03/25/2016  . Hypertension 03/25/2016  . Palliative care patient 01/11/2016  . Chronic right-sided back pain 01/11/2016  . 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 Recurrent URI (upper respiratory infection) 09/14/2015  . Subclinical hypothyroidism 09/12/2015  . Patient underweight 09/12/2015  . 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 Urinary retention 08/10/2015  . Chronic respiratory failure with hypoxia, on home oxygen therapy (HCC) 08/04/2015  . Hypoxia, sleep related 07/06/2015  . Allergic rhinitis 05/11/2015  . Prediabetes 05/11/2015  . Chronic bronchitis (HCC) 04/07/2015  . Peripheral neuropathy 04/07/2015    Past Surgical History:  Procedure Laterality Date  . none         Home Medications    Prior to Admission medications   Medication Sig Start Date End Date Taking? Authorizing Provider  albuterol (PROVENTIL HFA;VENTOLIN HFA)  108 (90 Base) MCG/ACT inhaler Inhale 2 puffs into the lungs every 6 (six) hours as needed for wheezing or shortness of breath. 07/06/15   Krebs, Laurel Dimmer, NP  atorvastatin (LIPITOR) 40 MG tablet Take 1 tablet (40 mg total) by mouth daily. 02/29/16   Karamalegos, Netta Neat, DO  azithromycin (ZITHROMAX Z-PAK) 250 MG tablet Take 2 tabs (500mg  total) on Day 1. Take 1 tab (250mg ) daily for next 4 days. 10/03/16   Karamalegos, Netta Neat, DO  baclofen (LIORESAL) 10 MG tablet Take 0.5-1 tablets (5-10 mg total) by mouth 3 (three) times daily as needed for muscle spasms. 05/07/16   Karamalegos, Netta Neat, DO  carbamide peroxide (EAR WAX REMOVAL DROPS) 6.5 % otic solution Place 5 drops into both ears 2 (two) times daily. Patient taking differently: Place 5 drops into both ears 2 (two) times daily as needed (ear wax).  05/11/15   Loura Pardon, NP  finasteride (PROSCAR) 5 MG tablet TAKE ONE (1) TABLET EACH DAY 08/13/16   Vanna Scotland, MD  fluticasone [facility]) 50 MCG/ACT nasal spray Place 2 sprays into both nostrils daily. 03/25/16   Karamalegos, Netta Neat, DO  Fluticasone-Umeclidin-Vilant (TRELEGY ELLIPTA) 100-62.5-25 MCG/INH AEPB Inhale 1 puff into the lungs daily. 05/20/16   Merwyn Katos, MD  gabapentin (NEURONTIN) 100 MG capsule TAKE ONE (1) CAPSULE THREE (3) TIMES EACH DAY AS NEEDED FOR NERVE PAIN 08/06/16   Smitty Cords, DO  ipratropium (ATROVENT) 0.06 % nasal spray Place 2 sprays into both  nostrils 4 (four) times daily. For up to 5-7 days then stop. 09/13/16   Karamalegos, Netta Neat, DO  Ipratropium-Albuterol (COMBIVENT) 20-100 MCG/ACT AERS respimat Inhale 1 puff into the lungs every 6 (six) hours as needed for wheezing or shortness of breath. 03/25/16   Karamalegos, Netta Neat, DO  lisinopril (PRINIVIL,ZESTRIL) 5 MG tablet Take 1 tablet (5 mg total) by mouth daily. 03/25/16   Karamalegos, Netta Neat, DO  LORazepam (ATIVAN) 1 MG tablet Take 0.5-1 tablets (0.5-1 mg total) by mouth every 8  (eight) hours as needed for anxiety or sleep. 09/13/16   Karamalegos, Netta Neat, DO  Meth-Hyo-M Bl-Na Phos-Ph Sal (URIBEL) 118 MG CAPS Take 1 capsule (118 mg total) by mouth QID. 08/30/15   McGowan, Carollee Herter A, PA-C  Nutritional Supplements (SCANDISHAKE) MISC Take 1 each by mouth daily. 08/10/15   Loura Pardon, NP  OXYGEN Inhale 2-3 L into the lungs continuous.    [provider]  predniSONE (DELTASONE) 5 MG tablet Take 3 tablets (15 mg total) by mouth daily with breakfast. 09/10/16   Merwyn Katos, MD  SILTUSSIN SA 100 MG/5ML syrup TAKE ONE TO TWO TEASPOONFULS EVERY FOUR HOURS AS NEEDED FOR COUGH EXPECTORANT 09/09/16   Karamalegos, Netta Neat, DO  tamsulosin (FLOMAX) 0.4 MG CAPS capsule Take 2 capsules (0.8 mg total) by mouth at bedtime. 03/25/16   Smitty Cords, DO    Family History Family History  Problem Relation Age of Onset  . Stroke Mother   . Diabetes Mother   . Heart disease Father   . Kidney disease Unknown        mothers side  . Prostate cancer Neg Hx     Social History Social History  Substance Use Topics  . Smoking status: Former Smoker    Packs/day: 1.00    Years: 56.00  . Smokeless tobacco: Former Neurosurgeon     Comment: quit in 2015  . Alcohol use 0.0 oz/week     Comment: occ     Allergies   Doxycycline and Sulfa antibiotics   Review of Systems Review of Systems  Unable to perform ROS: Mental status change     Physical Exam Updated Vital Signs BP 116/80   Pulse 88   Temp 98.1 F (36.7 C) (Oral)   Resp 15   Wt 54 kg (119 lb)   SpO2 100%   BMI 17.07 kg/m   Physical Exam  Constitutional: No distress.  Protein malnourished, appears chronically ill and very uncomfortable  HENT:  Head: Normocephalic and atraumatic.  Mouth coated with white plaques consistent with trush  Eyes: Pupils are equal, round, and reactive to light. EOM are normal.  Neck: Normal range of motion. Neck supple.  Cardiovascular: Normal rate and regular  rhythm.   Pulmonary/Chest: No respiratory distress. He has no wheezes.  Abdominal: Soft. He exhibits distension. There is tenderness.  Palpable urinary bladder up to umbilicus 896 ml retained on bladder scan  Genitourinary: Penis normal.  Genitourinary Comments: Normal male anatomy, uncircumcised. incontinent of feces  Neurological: He is alert. He is disoriented.  Nursing note and vitals reviewed.    ED Treatments / Results  Labs (all labs ordered are listed, but only abnormal results are displayed) Labs Reviewed  COMPREHENSIVE METABOLIC PANEL - Abnormal; Notable for the following:       Result Value   Chloride 90 (*)    CO2 33 (*)    Glucose, Bld 106 (*)    BUN 21 (*)    ALT  14 (*)    All other components within normal limits  CBC WITH DIFFERENTIAL/PLATELET - Abnormal; Notable for the following:    WBC 12.7 (*)    Neutro Abs 11.8 (*)    Lymphs Abs 0.3 (*)    All other components within normal limits  CULTURE, BLOOD (ROUTINE X 2)  CULTURE, BLOOD (ROUTINE X 2)  URINALYSIS, ROUTINE W REFLEX MICROSCOPIC  I-STAT CG4 LACTIC ACID, ED    EKG  EKG Interpretation None       Radiology Dg Chest Port 1 View  Result Date: 10/11/2016 CLINICAL DATA:  Urinary frequency and urinary tract infection EXAM: PORTABLE CHEST 1 VIEW COMPARISON:  None. FINDINGS: Heart is normal in size. There is aortic atherosclerosis with uncoiling of the thoracic aorta. Emphysematous hyperinflation of the lungs. Minimal atelectasis at the left lung base. Chronic biapical pleuroparenchymal scarring and minimal thickening. No effusion or pneumothorax. The visualized skeletal structures are unremarkable. IMPRESSION: 1. Emphysematous hyperinflation of the lungs with biapical pleuroparenchymal scarring and thickening. Left basilar atelectasis is currently identified without effusion or pneumothorax. 2. Uncoiled appearance of the thoracic aorta with atherosclerosis. Electronically Signed   By: Tollie Ethavid  Kwon M.D.   On:  10/11/2016 19:15    Procedures Procedures (including critical care time)  Medications Ordered in ED Medications  sodium chloride 0.9 % bolus 500 mL (500 mLs Intravenous New Bag/Given 10/11/16 1957)     Initial Impression / Assessment and Plan / ED Course  I have reviewed the triage vital signs and the nursing notes.  Pertinent labs & imaging results that were available during my care of the patient were reviewed by me and considered in my medical decision making (see chart for details).  Clinical Course as of Oct 12 2130  Caleen EssexFri Oct 11, 2016  2046 Patient's daughter at bedside, She states he has been increasingly somnolent and confused for the past for days. He has been refusing to eat or drink. He was able to take his normal medications. She states that he is normally lucid. He has been complaining of cp and sob at home and yesterday his family turned his 02 up to 5L because he was feeling so sob.  [AH]    Clinical Course User Index [AH] Arthor CaptainHarris, Jeffrey Voth, PA-C     patient will be admitted for acute urinary retention and encephalopathy. He also has oral candidiasis. He is able to tolerate fluids and medications. He has been given a single dose of Diflucan. Urine sent for culture. He is improved with fluids andpain relief. Urinary catheter in place.  Final Clinical Impressions(s) / ED Diagnoses   Final diagnoses:  Encephalopathy acute  Acute urinary retention  Oral candidiasis    New Prescriptions New Prescriptions   No medications on file     Arthor CaptainHarris, Jacqulyne Gladue, PA-C 10/12/16 0003    Gerhard MunchLockwood, Robert, MD 10/12/16 [phone]

## 2016-10-11 NOTE — ED Notes (Signed)
Bed: [id number] Expected date:  Expected time:  Means of arrival:  Comments: EMS 76 y/o COPD, infection

## 2016-10-11 NOTE — ED Notes (Signed)
Patient has great difficulty swallowing

## 2016-10-11 NOTE — ED Notes (Signed)
EDPA and student at bedside with patient-EDPA student placed foley catheter

## 2016-10-12 DIAGNOSIS — G9340 Encephalopathy, unspecified: Secondary | ICD-10-CM | POA: Diagnosis not present

## 2016-10-12 DIAGNOSIS — I10 Essential (primary) hypertension: Secondary | ICD-10-CM | POA: Diagnosis not present

## 2016-10-12 DIAGNOSIS — N401 Enlarged prostate with lower urinary tract symptoms: Secondary | ICD-10-CM | POA: Diagnosis not present

## 2016-10-12 DIAGNOSIS — N138 Other obstructive and reflux uropathy: Secondary | ICD-10-CM

## 2016-10-12 DIAGNOSIS — J449 Chronic obstructive pulmonary disease, unspecified: Secondary | ICD-10-CM | POA: Diagnosis not present

## 2016-10-12 DIAGNOSIS — R338 Other retention of urine: Secondary | ICD-10-CM | POA: Diagnosis not present

## 2016-10-12 DIAGNOSIS — J9611 Chronic respiratory failure with hypoxia: Secondary | ICD-10-CM | POA: Diagnosis not present

## 2016-10-12 DIAGNOSIS — B370 Candidal stomatitis: Secondary | ICD-10-CM | POA: Insufficient documentation

## 2016-10-12 DIAGNOSIS — G92 Toxic encephalopathy: Secondary | ICD-10-CM | POA: Diagnosis not present

## 2016-10-12 DIAGNOSIS — L8990 Pressure ulcer of unspecified site, unspecified stage: Secondary | ICD-10-CM | POA: Insufficient documentation

## 2016-10-12 DIAGNOSIS — J9612 Chronic respiratory failure with hypercapnia: Secondary | ICD-10-CM | POA: Diagnosis not present

## 2016-10-12 LAB — CBC
HCT: 39.2 % (ref 39.0–52.0)
Hemoglobin: 12.7 g/dL — ABNORMAL LOW (ref 13.0–17.0)
MCH: 28.9 pg (ref 26.0–34.0)
MCHC: 32.4 g/dL (ref 30.0–36.0)
MCV: 89.3 fL (ref 78.0–100.0)
PLATELETS: 270 10*3/uL (ref 150–400)
RBC: 4.39 MIL/uL (ref 4.22–5.81)
RDW: 13.4 % (ref 11.5–15.5)
WBC: 15.2 10*3/uL — AB (ref 4.0–10.5)

## 2016-10-12 LAB — COMPREHENSIVE METABOLIC PANEL
ALK PHOS: 56 U/L (ref 38–126)
ALT: 12 U/L — AB (ref 17–63)
AST: 18 U/L (ref 15–41)
Albumin: 3.1 g/dL — ABNORMAL LOW (ref 3.5–5.0)
Anion gap: 9 (ref 5–15)
BILIRUBIN TOTAL: 1.2 mg/dL (ref 0.3–1.2)
BUN: 19 mg/dL (ref 6–20)
CHLORIDE: 96 mmol/L — AB (ref 101–111)
CO2: 32 mmol/L (ref 22–32)
CREATININE: 1.03 mg/dL (ref 0.61–1.24)
Calcium: 9.2 mg/dL (ref 8.9–10.3)
Glucose, Bld: 73 mg/dL (ref 65–99)
POTASSIUM: 4.2 mmol/L (ref 3.5–5.1)
Sodium: 137 mmol/L (ref 135–145)
TOTAL PROTEIN: 5.8 g/dL — AB (ref 6.5–8.1)

## 2016-10-12 LAB — TROPONIN I
TROPONIN I: 0.05 ng/mL — AB (ref <0.03)
TROPONIN I: 0.03 ng/mL — AB (ref <0.03)

## 2016-10-12 MED ORDER — LISINOPRIL 5 MG PO TABS
5.0000 mg | ORAL_TABLET | Freq: Every day | ORAL | Status: DC
  Administered 2016-10-12 – 2016-10-13 (×2): 5 mg via ORAL
  Filled 2016-10-12 (×2): qty 1

## 2016-10-12 MED ORDER — IPRATROPIUM-ALBUTEROL 0.5-2.5 (3) MG/3ML IN SOLN: 3.0000 mL | Freq: Four times a day (QID) | RESPIRATORY_TRACT | Status: DC | PRN

## 2016-10-12 MED ORDER — TAMSULOSIN HCL 0.4 MG PO CAPS
0.8000 mg | ORAL_CAPSULE | Freq: Every day | ORAL | Status: DC
  Administered 2016-10-12 (×2): 0.8 mg via ORAL
  Filled 2016-10-12 (×2): qty 2

## 2016-10-12 MED ORDER — FLUCONAZOLE 100 MG PO TABS
100.0000 mg | ORAL_TABLET | Freq: Every day | ORAL | Status: DC
  Administered 2016-10-13: 100 mg via ORAL
  Filled 2016-10-12: qty 1

## 2016-10-12 MED ORDER — ATORVASTATIN CALCIUM 40 MG PO TABS
40.0000 mg | ORAL_TABLET | Freq: Every day | ORAL | Status: DC
  Administered 2016-10-12 – 2016-10-13 (×2): 40 mg via ORAL
  Filled 2016-10-12 (×2): qty 1

## 2016-10-12 MED ORDER — ONDANSETRON HCL 4 MG PO TABS: 4.0000 mg | ORAL_TABLET | Freq: Four times a day (QID) | ORAL | Status: DC | PRN

## 2016-10-12 MED ORDER — FINASTERIDE 5 MG PO TABS
5.0000 mg | ORAL_TABLET | Freq: Every day | ORAL | Status: DC
  Administered 2016-10-12 – 2016-10-13 (×2): 5 mg via ORAL
  Filled 2016-10-12 (×2): qty 1

## 2016-10-12 MED ORDER — ONDANSETRON HCL 4 MG/2ML IJ SOLN: 4.0000 mg | Freq: Four times a day (QID) | INTRAMUSCULAR | Status: DC | PRN

## 2016-10-12 MED ORDER — FLUTICASONE FUROATE-VILANTEROL 200-25 MCG/INH IN AEPB
1.0000 | INHALATION_SPRAY | Freq: Every day | RESPIRATORY_TRACT | Status: DC
  Administered 2016-10-12 – 2016-10-13 (×2): 1 via RESPIRATORY_TRACT
  Filled 2016-10-12 (×2): qty 28

## 2016-10-12 MED ORDER — TIOTROPIUM BROMIDE MONOHYDRATE 18 MCG IN CAPS
18.0000 ug | ORAL_CAPSULE | Freq: Every day | RESPIRATORY_TRACT | Status: DC
  Administered 2016-10-12 – 2016-10-13 (×2): 18 ug via RESPIRATORY_TRACT
  Filled 2016-10-12 (×2): qty 5

## 2016-10-12 MED ORDER — SODIUM CHLORIDE 0.9 % IV SOLN
INTRAVENOUS | Status: DC
  Administered 2016-10-12 (×2): via INTRAVENOUS

## 2016-10-12 MED ORDER — ACETAMINOPHEN 650 MG RE SUPP: 650.0000 mg | Freq: Four times a day (QID) | RECTAL | Status: DC | PRN

## 2016-10-12 MED ORDER — DOCUSATE SODIUM 100 MG PO CAPS
100.0000 mg | ORAL_CAPSULE | Freq: Two times a day (BID) | ORAL | Status: DC
  Administered 2016-10-12 – 2016-10-13 (×4): 100 mg via ORAL
  Filled 2016-10-12 (×4): qty 1

## 2016-10-12 MED ORDER — ENOXAPARIN SODIUM 40 MG/0.4ML ~~LOC~~ SOLN
40.0000 mg | SUBCUTANEOUS | Status: DC
  Administered 2016-10-12 – 2016-10-13 (×2): 40 mg via SUBCUTANEOUS
  Filled 2016-10-12 (×2): qty 0.4

## 2016-10-12 MED ORDER — PREDNISONE 20 MG PO TABS
20.0000 mg | ORAL_TABLET | Freq: Every day | ORAL | Status: DC
  Administered 2016-10-12 – 2016-10-13 (×2): 20 mg via ORAL
  Filled 2016-10-12 (×2): qty 1

## 2016-10-12 MED ORDER — ACETAMINOPHEN 325 MG PO TABS
650.0000 mg | ORAL_TABLET | Freq: Four times a day (QID) | ORAL | Status: DC | PRN
  Administered 2016-10-12 (×2): 650 mg via ORAL
  Filled 2016-10-12 (×2): qty 2

## 2016-10-12 NOTE — H&P (Signed)
[facility]  History and Physical Danyal Adorno L. Link Snuffer, MD Pager [phone] (if 7P to 7A, page night hospitalist on [email or web address] Caras AVW:[id number] DOB: [birth year] DOA: 10/11/2016  Referring physician: ED  PCP: Smitty Cords, DO   Chief Complaint: altered mental status, urinary retention   HPI:  Pt is 76 yo male w/ hx of end stage COPD on 20mg  pred and 3L [place] daily along w/ trelogy and prn combivent presents w/ worsening mental status over the past 4 days. Daughter at bedside states he has been only sleeping for the past 4 days . Wakes to ask for pain control and she gives his meds, otherwise sleeping 24/7 . He was also complaining of dyspnea over the last 24 hours. O2 was turned up at home from 3L to 5L . He has not compliant w/ inhalers over past 4 days b/c of altered mental state impairing his ability to cooperate.  She states urinary retention was worse over past 24 hours leading to their presentation tonight. He has known hx of BPH on flomax. ED provider placed foley and received over 1L of urine. No signs of UTI on u/a . In the ED he is complaining of chest pain but it is worse w coughing or movement . After resolving urinary retention, he noticed moderate mental state improvement compared to the report I received from ED provider. He is oriented to person, place and situation. VSS. 98% on 3.5L [place]. Labs showed hypercarbia but ABG was normal pH .   Chart Review:  ED notes, labs, images  Review of Systems:  Having chest pain w/ movement   Past Medical History:  Diagnosis Date  . Anxiety and depression   . Chronic bronchiolitis (HCC)   . 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 Diverticulitis   . Urinary retention   . Urine incontinence     Past Surgical History:  Procedure Laterality Date  . none      Social History:  reports that he has quit smoking. He has a 56.00 pack-year smoking history. He has quit using smokeless tobacco. He  reports that he drinks alcohol. He reports that he does not use drugs.  Allergies  Allergen Reactions  . Doxycycline Anaphylaxis and Rash    Given Epi-pen at [facility]  . Sulfa Antibiotics Other (See Comments)    pain    Family History  Problem Relation Age of Onset  . Stroke Mother   . Diabetes Mother   . Heart disease Father   . Kidney disease Unknown        mothers side  . Prostate cancer Neg Hx      Prior to Admission medications   Medication Sig Start Date End Date Taking? Authorizing Provider  acetaminophen (TYLENOL) 325 MG tablet Take 325 mg by mouth every 6 (six) hours as needed for mild pain or moderate pain.   Yes [provider]  albuterol (PROVENTIL HFA;VENTOLIN HFA) 108 (90 Base) MCG/ACT inhaler Inhale 2 puffs into the lungs every 6 (six) hours as needed for wheezing or shortness of breath. 07/06/15  Yes Krebs, Amy Lauren, NP  atorvastatin (LIPITOR) 40 MG tablet Take 1 tablet (40 mg total) by mouth daily. 02/29/16  Yes Karamalegos, Netta Neat, DO  baclofen (LIORESAL) 10 MG tablet Take 0.5-1 tablets (5-10 mg total) by mouth 3 (three) times daily as needed for muscle spasms. 05/07/16  Yes Karamalegos, Alexander J, DO  carbamide peroxide (EAR WAX REMOVAL DROPS) 6.5 %  otic solution Place 5 drops into both ears 2 (two) times daily. Patient taking differently: Place 5 drops into both ears 2 (two) times daily as needed (ear wax).  05/11/15  Yes Krebs, Laurel DimmerAmy Lauren, NP  finasteride (PROSCAR) 5 MG tablet TAKE ONE (1) TABLET EACH DAY 08/13/16  Yes Vanna ScotlandBrandon, Ashley, MD  fluticasone [facility](FLONASE) 50 MCG/ACT nasal spray Place 2 sprays into both nostrils daily. Patient taking differently: Place 2 sprays into both nostrils daily as needed for allergies or rhinitis.  03/25/16  Yes Karamalegos, Netta NeatAlexander J, DO  Fluticasone-Umeclidin-Vilant (TRELEGY ELLIPTA) 100-62.5-25 MCG/INH AEPB Inhale 1 puff into the lungs daily. 05/20/16  Yes Merwyn KatosSimonds, David B, MD  gabapentin (NEURONTIN) 100 MG capsule TAKE ONE  (1) CAPSULE THREE (3) TIMES EACH DAY AS NEEDED FOR NERVE PAIN 08/06/16  Yes Karamalegos, Netta NeatAlexander J, DO  HYDROcodone-acetaminophen (NORCO/VICODIN) 5-325 MG tablet Take 0.5 tablets by mouth every 6 (six) hours as needed for moderate pain.   Yes [provider]  ipratropium (ATROVENT) 0.06 % nasal spray Place 2 sprays into both nostrils 4 (four) times daily. For up to 5-7 days then stop. 09/13/16  Yes Karamalegos, Netta NeatAlexander J, DO  Ipratropium-Albuterol (COMBIVENT) 20-100 MCG/ACT AERS respimat Inhale 1 puff into the lungs every 6 (six) hours as needed for wheezing or shortness of breath. 03/25/16  Yes Karamalegos, Netta NeatAlexander J, DO  lisinopril (PRINIVIL,ZESTRIL) 5 MG tablet Take 1 tablet (5 mg total) by mouth daily. 03/25/16  Yes Karamalegos, Netta NeatAlexander J, DO  LORazepam (ATIVAN) 1 MG tablet Take 0.5-1 tablets (0.5-1 mg total) by mouth every 8 (eight) hours as needed for anxiety or sleep. Patient taking differently: Take 0.5 mg by mouth every 8 (eight) hours as needed for anxiety or sleep.  09/13/16  Yes Karamalegos, Netta NeatAlexander J, DO  Nutritional Supplements (SCANDISHAKE) MISC Take 1 each by mouth daily. 08/10/15  Yes Krebs, Amy Lauren, NP  OXYGEN Inhale 3-5 L into the lungs continuous.    Yes [provider]  predniSONE (DELTASONE) 20 MG tablet Take 20 mg by mouth daily. 09/16/16  Yes [provider]  SILTUSSIN SA 100 MG/5ML syrup TAKE ONE TO TWO TEASPOONFULS EVERY FOUR HOURS AS NEEDED FOR COUGH EXPECTORANT 09/09/16  Yes Karamalegos, Netta NeatAlexander J, DO  tamsulosin (FLOMAX) 0.4 MG CAPS capsule Take 2 capsules (0.8 mg total) by mouth at bedtime. 03/25/16  Yes Karamalegos, Netta NeatAlexander J, DO  azithromycin (ZITHROMAX Z-PAK) 250 MG tablet Take 2 tabs (500mg  total) on Day 1. Take 1 tab (250mg ) daily for next 4 days. Patient not taking: Reported on 10/11/2016 10/03/16   Smitty CordsKaramalegos, Alexander J, DO  Meth-Hyo-M Bl-Na Phos-Ph Sal (URIBEL) 118 MG CAPS Take 1 capsule (118 mg total) by mouth QID. Patient not  taking: Reported on 10/11/2016 08/30/15   Michiel CowboyMcGowan, Shannon A, PA-C  predniSONE (DELTASONE) 5 MG tablet Take 3 tablets (15 mg total) by mouth daily with breakfast. Patient not taking: Reported on 10/11/2016 09/10/16   Merwyn KatosSimonds, David B, MD   Physical Exam: Vitals:   10/11/16 2230 10/11/16 2243 10/11/16 2300 10/11/16 2330  BP: 130/83 130/83 130/90 127/84  Pulse: 65 89 68 79  Resp: 16 15 14 15   Temp:      TempSrc:      SpO2: 100% 99% 100% 98%  Weight:         General:  WM in NAD, fatigued, hungry, AAOx3   HEENT: dry MM , anicteric   Cardiovascular: RRR, no MRG   Respiratory: diminished air movement but no wheezing.   Abdomen: soft ,  NT, ND  Skin: warm, dry  Musculoskeletal: no focal deficits  Psychiatric: not anxious , relaxed   Neurologic: no focal deficits   Wt Readings from Last 3 Encounters:  10/11/16 54 kg (119 lb)  09/13/16 54 kg (119 lb)  05/20/16 56.7 kg (125 lb)    Labs on Admission:  Basic Metabolic Panel:  Recent Labs Lab 10/11/16 1836  NA 136  K 4.7  CL 90*  CO2 33*  GLUCOSE 106*  BUN 21*  CREATININE 1.10  CALCIUM 10.3   Liver Function Tests:  Recent Labs Lab 10/11/16 1836  AST 21  ALT 14*  ALKPHOS 70  BILITOT 1.0  PROT 7.3  ALBUMIN 3.9   No results for input(s): LIPASE, AMYLASE in the last 168 hours. No results for input(s): AMMONIA in the last 168 hours. CBC:  Recent Labs Lab 10/11/16 1836  WBC 12.7*  NEUTROABS 11.8*  HGB 15.0  HCT 44.8  MCV 89.1  PLT 265   Cardiac Enzymes: No results for input(s): CKTOTAL, CKMB, CKMBINDEX, TROPONINI in the last 168 hours.  BNP (last 3 results) No results for input(s): BNP in the last 8760 hours.  ProBNP (last 3 results) No results for input(s): PROBNP in the last 8760 hours.  CBG: No results for input(s): GLUCAP in the last 168 hours.  Radiological Exams on Admission: Dg Chest Port 1 View  Result Date: 10/11/2016 CLINICAL DATA:  Urinary frequency and urinary tract infection EXAM:  PORTABLE CHEST 1 VIEW COMPARISON:  None. FINDINGS: Heart is normal in size. There is aortic atherosclerosis with uncoiling of the thoracic aorta. Emphysematous hyperinflation of the lungs. Minimal atelectasis at the left lung base. Chronic biapical pleuroparenchymal scarring and minimal thickening. No effusion or pneumothorax. The visualized skeletal structures are unremarkable. IMPRESSION: 1. Emphysematous hyperinflation of the lungs with biapical pleuroparenchymal scarring and thickening. Left basilar atelectasis is currently identified without effusion or pneumothorax. 2. Uncoiled appearance of the thoracic aorta with atherosclerosis. Electronically Signed   By: Tollie Eth M.D.   On: 10/11/2016 19:15    EKG: Independently reviewed.   Active Problems:   Encephalopathy   Assessment/Plan 1. Encephalopathy - unclear etiology. ? Related to urinary retention - > 1000cc drained in ED, foley in place . ? Sedating or mood altering meds at home - holding ativan, tussin, neurontin, baclofen, and norco at this time . ? Hypercarbia - at home they had advanced his O2 from 3L to 5 or 6L b/c he appeared dyspneic , could have lead to decreased respiratory drive in end stage COPDer - Resuming home O2 at 3L . ABG w/o acidosis in ED. NPO w/ swallow eval by speech pathology in AM. Admit to tele bed   2. Urinary retention w/ BPH - continue home proscar and flomax . Foley in place currently 3. Anxiety - ativan on hold . Can add as needed going forward  4. Pain - apap prn for pain initially while altered. Holding norco, baclofen, neurontin given potentially altering mental status 5. COPD - no acidosis on ABG. Resume home inhalers, O2, and steroids .  6. Thrush - starting on diflucan    Code Status: full Family Communication: daughter at bedside Disposition Plan/Anticipated LOS: home in 3-4 days   Time spent: 70 minutes  Alysia Penna, MD  Internal Medicine Pager [phone] Cell [phone] If 7PM-7AM,  please contact night-coverage at [email or web address], password [facility] 10/12/2016, 12:01 AM

## 2016-10-12 NOTE — Evaluation (Signed)
Clinical/Bedside Swallow Evaluation Patient Details  Name: Connor Lewis MRN: [id number] Date of Birth: April 07, 1940  Today's Date: 10/12/2016 Time: SLP Start Time (ACUTE ONLY): 1550 SLP Stop Time (ACUTE ONLY): 1615 SLP Time Calculation (min) (ACUTE ONLY): 25 min  Past Medical History:  Past Medical History:  Diagnosis Date  . Anxiety and depression   . Chronic bronchiolitis (HCC)   . 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 Diverticulitis   . Urinary retention   . Urine incontinence    Past Surgical History:  Past Surgical History:  Procedure Laterality Date  . none     HPI:  76 y.o. 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 He was brought to the ED by his daughter for 4 days of decreasing alertness, confusion, and a day of no urine output.    Assessment / Plan / Recommendation Clinical Impression  Patient presents with a functional oropharyngeal swallow and did not exhibit any overt s/s of aspiration or penetration. Pharyngeal contraction and laryngeal elevation were both WFL per palpation.  SLP Visit Diagnosis: Dysphagia, unspecified (R13.10)    Aspiration Risk  No limitations    Diet Recommendation Regular;Thin liquid   Liquid Administration via: Cup;Straw Medication Administration: Whole meds with liquid Supervision: Patient able to self feed Postural Changes: Seated upright at 90 degrees    Other  Recommendations Oral Care Recommendations: Oral care BID   Follow up Recommendations        Frequency and Duration   N/A         Prognosis   N/A     Swallow Study   General Date of Onset: 10/11/16 HPI: 76 y.o. 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 He was brought to the ED by his daughter for 4 days of decreasing alertness, confusion, and a day of no urine output.   Type of Study: Bedside Swallow Evaluation Previous Swallow Assessment: N/A Diet Prior to this Study: Regular;Thin liquids Temperature Spikes Noted: No Respiratory Status: Nasal cannula History of Recent Intubation: No Behavior/Cognition: Alert;Cooperative;Pleasant mood;Confused Oral Cavity Assessment: Within Functional Limits Oral Care Completed by SLP: No Oral Cavity - Dentition: Missing dentition Vision: Functional for self-feeding Self-Feeding Abilities: Able to feed self Patient Positioning: Upright in bed Baseline Vocal Quality: Normal Volitional Cough: Strong Volitional Swallow: Able to elicit    Oral/Motor/Sensory Function Overall Oral Motor/Sensory Function: Within functional limits   Ice Chips     Thin Liquid Thin Liquid: Within functional limits Presentation: Straw;Self Fed Other Comments: No overt s/s of aspiration or penetration.    Nectar Thick     Honey Thick     Puree Puree: Within functional limits   Solid   GO   Solid: Not tested       Connor NevinJohn T. [name], MA, CCC-SLP 10/12/16 4:56 PM

## 2016-10-12 NOTE — ED Notes (Signed)
ED TO INPATIENT HANDOFF REPORT  Name/Age/Gender Connor Lewis 76 y.o. male  Home/SNF/Other Home  Chief Complaint UTI, fever, respiratory infection  Code Status History    Date Active Date Inactive Code Status Order ID Comments User Context   08/04/2015  4:00 AM 08/04/2015  7:43 PM Full Code [id number]  Harrie Foreman, MD Inpatient      Level of Care/Admitting Diagnosis ED Disposition    ED Disposition Condition [facility] Area: [facility] [[id number]]  Level of Care: Telemetry [5]  Admit to tele based on following criteria: Other see comments  Comments: encephalopathy  Diagnosis: Encephalopathy [269485]  Admitting Physician: Marina Gravel  Attending Physician: Velna Hatchet [[id number]]  Estimated length of stay: 3 - 4 days  Certification:: I certify this patient will need inpatient services for at least 2 midnights  PT Class (Do Not Modify): Inpatient [101]  PT Acc Code (Do Not Modify): Private [1]       Medical History Past Medical History:  Diagnosis Date  . Anxiety and depression   . Chronic bronchiolitis ([place])   . 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 Diverticulitis   . Urinary retention   . Urine incontinence     Allergies Allergies  Allergen Reactions  . Doxycycline Anaphylaxis and Rash    Given Epi-pen at [facility]  . Sulfa Antibiotics Other (See Comments)    pain    IV Location/Drains/Wounds Patient Lines/Drains/Airways Status   Active Line/Drains/Airways    Name:   Placement date:   Placement time:   Site:   Days:   Peripheral IV 10/11/16 Left Forearm  10/11/16        Forearm    1   Peripheral IV 10/11/16 Right Forearm  10/11/16    1917    Forearm    1   Urethral Catheter Rachel Pienknagura Latex 16 Fr.  10/11/16    1945    Latex    1          Labs/Imaging Results for orders placed or performed during the hospital encounter of 10/11/16 (from the past 48 hour(s))  Comprehensive  metabolic panel     Status: Abnormal   Collection Time: 10/11/16  6:36 PM  Result Value Ref Range   Sodium 136 135 - 145 mmol/L   Potassium 4.7 3.5 - 5.1 mmol/L   Chloride 90 (L) 101 - 111 mmol/L   CO2 33 (H) 22 - 32 mmol/L   Glucose, Bld 106 (H) 65 - 99 mg/dL   BUN 21 (H) 6 - 20 mg/dL   Creatinine, Ser 1.10 0.61 - 1.24 mg/dL   Calcium 10.3 8.9 - 10.3 mg/dL   Total Protein 7.3 6.5 - 8.1 g/dL   Albumin 3.9 3.5 - 5.0 g/dL   AST 21 15 - 41 U/L   ALT 14 (L) 17 - 63 U/L   Alkaline Phosphatase 70 38 - 126 U/L   Total Bilirubin 1.0 0.3 - 1.2 mg/dL   GFR calc non Af Amer >60 >60 mL/min   GFR calc Af Amer >60 >60 mL/min    Comment: (NOTE) The eGFR has been calculated using the CKD EPI equation. This calculation has not been validated in all clinical situations. eGFR's persistently <60 mL/min signify possible Chronic Kidney Disease.    Anion gap 13 5 - 15  CBC WITH DIFFERENTIAL     Status: Abnormal   Collection Time: 10/11/16  6:36 PM  Result  Value Ref Range   WBC 12.7 (H) 4.0 - 10.5 K/uL   RBC 5.03 4.22 - 5.81 MIL/uL   Hemoglobin 15.0 13.0 - 17.0 g/dL   HCT 44.8 39.0 - 52.0 %   MCV 89.1 78.0 - 100.0 fL   MCH 29.8 26.0 - 34.0 pg   MCHC 33.5 30.0 - 36.0 g/dL   RDW 13.3 11.5 - 15.5 %   Platelets 265 150 - 400 K/uL   Neutrophils Relative % 93 %   Neutro Abs 11.8 (H) 1.7 - 7.7 K/uL   Lymphocytes Relative 2 %   Lymphs Abs 0.3 (L) 0.7 - 4.0 K/uL   Monocytes Relative 5 %   Monocytes Absolute 0.7 0.1 - 1.0 K/uL   Eosinophils Relative 0 %   Eosinophils Absolute 0.0 0.0 - 0.7 K/uL   Basophils Relative 0 %   Basophils Absolute 0.0 0.0 - 0.1 K/uL  Urinalysis, Routine w reflex microscopic     Status: Abnormal   Collection Time: 10/11/16  6:36 PM  Result Value Ref Range   Color, Urine YELLOW YELLOW   APPearance CLEAR CLEAR   Specific Gravity, Urine 1.013 1.005 - 1.030   pH 6.0 5.0 - 8.0   Glucose, UA NEGATIVE NEGATIVE mg/dL   Hgb urine dipstick SMALL (A) NEGATIVE   Bilirubin Urine  NEGATIVE NEGATIVE   Ketones, ur 20 (A) NEGATIVE mg/dL   Protein, ur NEGATIVE NEGATIVE mg/dL   Nitrite NEGATIVE NEGATIVE   Leukocytes, UA NEGATIVE NEGATIVE   RBC / HPF 0-5 0 - 5 RBC/hpf   WBC, UA 0-5 0 - 5 WBC/hpf   Bacteria, UA RARE (A) NONE SEEN   Squamous Epithelial / LPF NONE SEEN NONE SEEN   Hyaline Casts, UA PRESENT    Sperm, UA PRESENT   I-Stat CG4 Lactic Acid, ED  (not at  [facility])     Status: None   Collection Time: 10/11/16  7:19 PM  Result Value Ref Range   Lactic Acid, Venous 1.59 0.5 - 1.9 mmol/L  Blood gas, arterial ([facility] & [facility] ONLY)     Status: Abnormal   Collection Time: 10/11/16  8:54 PM  Result Value Ref Range   O2 Content 3.0 L/min   Delivery systems NASAL CANNULA    pH, Arterial 7.435 7.350 - 7.450   pCO2 arterial 50.3 (H) 32.0 - 48.0 mmHg   pO2, Arterial 77.9 (L) 83.0 - 108.0 mmHg   Bicarbonate 33.2 (H) 20.0 - 28.0 mmol/L   Acid-Base Excess 8.0 (H) 0.0 - 2.0 mmol/L   O2 Saturation 95.4 %   Patient temperature 98.6    Collection site LEFT RADIAL    Drawn by [id number]    Sample type ARTERIAL DRAW    Allens test (pass/fail) PASS PASS  I-stat troponin, ED     Status: None   Collection Time: 10/11/16  9:05 PM  Result Value Ref Range   Troponin i, poc 0.02 0.00 - 0.08 ng/mL   Comment 3            Comment: Due to the release kinetics of cTnI, a negative result within the first hours of the onset of symptoms does not rule out myocardial infarction with certainty. If myocardial infarction is still suspected, repeat the test at appropriate intervals.   I-Stat CG4 Lactic Acid, ED  (not at  [facility])     Status: None   Collection Time: 10/11/16 10:11 PM  Result Value Ref Range   Lactic Acid, Venous 0.94 0.5 - 1.9 mmol/L  Dg Chest Port 1 View  Result Date: 10/11/2016 CLINICAL DATA:  Urinary frequency and urinary tract infection EXAM: PORTABLE CHEST 1 VIEW COMPARISON:  None. FINDINGS: Heart is normal in size. There is aortic atherosclerosis with uncoiling of the thoracic  aorta. Emphysematous hyperinflation of the lungs. Minimal atelectasis at the left lung base. Chronic biapical pleuroparenchymal scarring and minimal thickening. No effusion or pneumothorax. The visualized skeletal structures are unremarkable. IMPRESSION: 1. Emphysematous hyperinflation of the lungs with biapical pleuroparenchymal scarring and thickening. Left basilar atelectasis is currently identified without effusion or pneumothorax. 2. Uncoiled appearance of the thoracic aorta with atherosclerosis. Electronically Signed   By: Ashley Royalty M.D.   On: 10/11/2016 19:15    Pending Labs Unresulted Labs    Start     Ordered   10/11/16 2352  Urine Culture  STAT,   STAT     10/11/16 2351   10/11/16 1836  Blood Culture (routine x 2)  BLOOD CULTURE X 2,   STAT     10/11/16 1841   Signed and Held  CBC  (enoxaparin (LOVENOX)    CrCl >/= 30 ml/min)  Once,   R    Comments:  Baseline for enoxaparin therapy IF NOT ALREADY DRAWN.  Notify MD if PLT < 100 K.    Signed and Held   Signed and Held  Creatinine, serum  (enoxaparin (LOVENOX)    CrCl >/= 30 ml/min)  Once,   R    Comments:  Baseline for enoxaparin therapy IF NOT ALREADY DRAWN.    Signed and Held   Signed and Held  Creatinine, serum  (enoxaparin (LOVENOX)    CrCl >/= 30 ml/min)  Weekly,   R    Comments:  while on enoxaparin therapy    Signed and Held   Signed and Held  Comprehensive metabolic panel  Tomorrow morning,   R     Signed and Held   Signed and Held  CBC  Tomorrow morning,   R     Signed and Held   Signed and Held  Troponin I  Once,   R     Signed and Held      Isolation Precautions No active isolations  Vitals/Pain Today's Vitals   10/11/16 2243 10/11/16 2300 10/11/16 2330 10/12/16 0000  BP: 130/83 130/90 127/84 111/82  Pulse: 89 68 79 88  Resp: 15 14 15 13   Temp:      TempSrc:      SpO2: 99% 100% 98% 99%  Weight:        Medications Medications  fluticasone furoate-vilanterol (BREO ELLIPTA) 200-25 MCG/INH 1 puff (not  administered)  tiotropium (SPIRIVA) inhalation capsule 18 mcg (not administered)  sodium chloride 0.9 % bolus 500 mL (0 mLs Intravenous Stopped 10/11/16 2040)  fluconazole (DIFLUCAN) tablet 150 mg (150 mg Oral Given 10/11/16 2158)    Mobility walks with person assist

## 2016-10-12 NOTE — ED Notes (Signed)
Call Earley AbideHilda, RN with repot at 01:05, [phone]

## 2016-10-12 NOTE — ED Notes (Signed)
Bed assignment changed from stepdown to tele

## 2016-10-12 NOTE — ED Notes (Signed)
[facility]

## 2016-10-12 NOTE — Progress Notes (Signed)
PROGRESS NOTE  Connor CarasJohn Basto  VWU:[id number] DOB: [birth year] DOA: 10/11/2016 PCP: Smitty CordsKaramalegos, Alexander J, DO  Outpatient Specialists: [facility]  Brief Narrative: Connor CarasJohn Lewis is a 76 y.o. 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 He was brought to the ED by his daughter for 4 days of decreasing alertness, confusion, and a day of no urine output.   His daughter reported he'd been almost solely sleeping the past 4 days, waking up only to report severe pain "all over" for which she administered his medications. He was also stating he "couldn't breathe," for which she turned his oxygen up to 5LPM, though he was unable to participate in inhaled treatments. On arrival he was afebrile, 98% on 3.5L, oriented to self only. A foley was placed with return of about 1 liter of urine which appeared normal on urinalysis, with improvement in mental status. He continued to be slightly delirious, so was admitted. Sedating medications were held.   Subjective: Grouchy this morning, wants to eat. He is unable to confirm the above history and states he feels better today, though he can't tell me what specifically feels better. He denies chest pain, dyspnea, change in cough or sputum production, weakness, numbness, and fever.   Objective: BP 95/67 (BP Location: Right Arm)   Pulse 83   Temp 98.8 F (37.1 C) (Oral)   Resp 16   Ht 5\' 9"  (1.753 m)   Wt 52.2 kg (115 lb 1.3 oz)   SpO2 97%   BMI 16.99 kg/m   Gen: Chronically ill-appearing 76yo male in no distress Pulm: Very diminished but clear and nonlabored on room air (pt took O2 off and lost it)  CV: RRR, no murmur, no JVD, no edema GI: Soft, NT, ND, +BS  Neuro/psych: Alert and oriented x3 this AM. No focal deficits. Has severe word blocking and slow to speak. Has goal-directed speech. Severely impaired short term recall.    Assessment &  Plan: Acute toxic metabolic encephalopathy: Significant improvement with relief of acute urinary retention. Suspect recent days of decline were from sedative medications.  - Continue to hold sedating medications: ativan, norco, gabapentin, and baclofen.  - Very low suspicion for seizure or cerebrovascular insult at this time. Would proceed with neuroimaging and neurology consult if focal deficits or seizure-like activity develops.  - ABG on admission showed compensated hypercarbia with normal pH, so in the absence of baseline pCO2, this is not suspected to be contributing.  - No evidence of aphasia, dysphagia with administration of medications, will start diet.  - No nidus for infection found on CXR, UA. Normal lactic acid. Leukocytosis noted, though in absence of source and fever, favor this is steroid-related. Will monitor blood and urine cultures.  - HIV has not been checked, no recent TSH (6.190 in June 2017 with normal T3 and T4) or B12 (615 in Feb 2017). Will order these for completeness.   BPH and acute urinary retention: Seen by a male urologist in [place], whose name he can't recall. It's possible medications and/or pain could have tipped him into retention which he is predisposed to.  - Will attempt voiding trial. May have to discharge with foley.  - Continue flomax 0.8mg , finasteride - [facility].   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 No acidosis on ABG.  He and his daughter deny any symptoms of exacerbation.  - Continue home O2, would set goal SpO2 at 88-92% - Continue home inhaled treatments - Continue home prednisone 20mg   Chronic pain and anxiety:  - Hold medications as above. Would preferentially add back baclofen and gabapentin if chronic pain flares.  - Holding ativan, no evidence of physiologic withdrawal.  Oral thrush: Was  noted by admitting MD.  - Was started on fluconazole po on 8/10.   Troponin elevation: Mild 0.03 > 0.05 > 0.03. ECG is nonischemic. No chest pain. Most likely demand ischemia with urinary retention (initially tachycardic and hypertensive) - No further inpatient evaluation planned.  Family Communication: Discussed with Daughter, with whom he lives, by phone today. Disposition Plan: Anticipate DC back home once back to mental baseline. Anticipated 8/12.   Time spent: 25 minutes.  Hazeline Junker, MD [facility] Pager [phone]  If 7PM-7AM, please contact night-coverage [email or web address] Password [facility] 10/12/2016, 2:57 PM

## 2016-10-12 NOTE — Progress Notes (Signed)
CRITICAL VALUE ALERT  Critical Value:  Troponin 0.05  Date & Time Notied:  10/12/16@0305   Provider Notified: yes  Orders Received/Actions taken: none

## 2016-10-12 NOTE — ED Notes (Signed)
Patient in isolation at this time and unable to accept report-will call back at 0105

## 2016-10-13 DIAGNOSIS — I10 Essential (primary) hypertension: Secondary | ICD-10-CM | POA: Diagnosis not present

## 2016-10-13 DIAGNOSIS — G92 Toxic encephalopathy: Secondary | ICD-10-CM | POA: Diagnosis not present

## 2016-10-13 DIAGNOSIS — B370 Candidal stomatitis: Secondary | ICD-10-CM | POA: Diagnosis not present

## 2016-10-13 DIAGNOSIS — R338 Other retention of urine: Secondary | ICD-10-CM | POA: Diagnosis not present

## 2016-10-13 DIAGNOSIS — J449 Chronic obstructive pulmonary disease, unspecified: Secondary | ICD-10-CM | POA: Diagnosis not present

## 2016-10-13 DIAGNOSIS — N401 Enlarged prostate with lower urinary tract symptoms: Secondary | ICD-10-CM | POA: Diagnosis not present

## 2016-10-13 DIAGNOSIS — G9340 Encephalopathy, unspecified: Secondary | ICD-10-CM | POA: Diagnosis not present

## 2016-10-13 DIAGNOSIS — J9611 Chronic respiratory failure with hypoxia: Secondary | ICD-10-CM | POA: Diagnosis not present

## 2016-10-13 DIAGNOSIS — N138 Other obstructive and reflux uropathy: Secondary | ICD-10-CM | POA: Diagnosis not present

## 2016-10-13 DIAGNOSIS — J9612 Chronic respiratory failure with hypercapnia: Secondary | ICD-10-CM | POA: Diagnosis not present

## 2016-10-13 LAB — HIV ANTIBODY (ROUTINE TESTING W REFLEX): HIV Screen 4th Generation wRfx: NONREACTIVE

## 2016-10-13 LAB — URINE CULTURE: Culture: NO GROWTH

## 2016-10-13 LAB — VITAMIN B12: Vitamin B-12: 603 pg/mL (ref 180–914)

## 2016-10-13 LAB — TSH: TSH: 0.872 u[IU]/mL (ref 0.350–4.500)

## 2016-10-13 LAB — RPR: RPR: NONREACTIVE

## 2016-10-13 NOTE — Discharge Summary (Addendum)
Physician Discharge Summary  Connor Lewis QQV:[id number] DOB: [birth year] DOA: 10/11/2016  PCP: Smitty Cords, DO  Admit date: 10/11/2016 Discharge date: 10/16/2016  Admitted From: Home Disposition: Home   Recommendations for Outpatient Follow-up:  1. Follow up with PCP in 1-2 weeks. 2. Needs follow up with urology in [place] in the next 1 - 2 weeks. Discharged with foley catheter (placed 8/12) due to acute urinary retention and failed voiding trial prior to discharge.  3. Monitor mental status; urged to minimize sedating medications as able. 4. HIV, RPR pending and blood cultures NGTD (2 days) at discharge.   Home Health: PT Equipment/Devices: 3 in 1 Discharge Condition: Stable CODE STATUS: Full Diet recommendation: As tolerated  Brief/Interim Summary: Connor Lewis is a 76 y.o. 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 He was brought to the ED by his daughter for 4 days of decreasing alertness, confusion, and a day of no urine output.   His daughter reported he'd been almost solely sleeping the past 4 days, waking up only to report severe pain "all over" for which she administered his medications. He was also stating he "couldn't breathe," for which she turned his oxygen up to 5LPM, though he was unable to participate in inhaled treatments. On arrival he was afebrile, 98% on 3.5L, oriented to self only. A foley was placed with return of about 1 liter of urine which appeared normal on urinalysis, with improvement in mental status. He continued to be slightly delirious, so was admitted. Sedating medications were held and mentation has returned to premorbid baseline.   Discharge Diagnoses:  Principal Problem:   Encephalopathy Active Problems:   Peripheral neuropathy   Urinary retention   BPH with obstruction/lower urinary tract symptoms   End stage COPD (HCC)   Hypertension  Acute toxic  metabolic encephalopathy: Significant improvement with relief of acute urinary retention. Suspect recent days of decline were from sedative medications.  - Held sedating medications: ativan, norco, gabapentin, and baclofen while inpatient. - Very low suspicion for seizure or cerebrovascular insult at this time. Would proceed with neuroimaging and neurology consult if focal deficits or seizure-like activity develops.  - ABG on admission showed compensated hypercarbia with normal pH, so in the absence of baseline pCO2, this is not suspected to be contributing.  - No evidence of aphasia, dysphagia, passed SLP evaluation. - No nidus for infection found on CXR, UA (Cx negative). Normal lactic acid. Leukocytosis noted, though in absence of source and fever, favor this is steroid-related. Blood cultures negative x2 days. - HIV pending, RPR pending.  - B12 and TSH wnl.  BPH and acute urinary retention: Seen by ?male urologist in [place], whose name he can't recall. It's possible medications and/or pain could have tipped him into retention which he is predisposed to.  - Failed voiding trial, will discharge with foley, placed 10/13/2016 - Follow up with [facility] as outpatient.  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 No acidosis on ABG. He and his daughter deny any symptoms of exacerbation.  - Continue home O2, would set goal SpO2 at 88-95% - Continue home inhaled treatments - Continue home prednisone 20mg   Chronic pain and anxiety:  - Held medications as above. Discussed with patient and family that I would preferentially add back baclofen and gabapentin if  chronic pain flares.  - Holding norco and ativan, no evidence of physiologic withdrawal.  Oral thrush: Was noted by admitting MD, who started diflucan. Pt has no symptoms, so this will be  discontinued.   Troponin elevation: Mild 0.03 > 0.05 > 0.03. ECG is nonischemic. No chest pain. Most likely demand ischemia with urinary retention (initially tachycardic and hypertensive) - No further inpatient evaluation planned.  Weakness: Pt has not been up under his own power in about a week. Out of concern for deconditioning, PT consulted to ambulate with patient who became fatigued quickly but was minimal assist. Home health physical therapy ordered prior to discharge.   Discharge Instructions Discharge Instructions    Discharge instructions    Complete by:  As directed    You were admitted with acute urinary retention and altered mental status.  - Continue with foley catheter until you follow up with [facility]. Call tomorrow to schedule an appointment with them no longer than 3 weeks from now. - It is recommended that you minimize use of sedating medications as much as possible. If you have severe pain, I would recommend taking baclofen and gabapentin as needed before you try norco/hydrocodone. Also try to minimize use of ativan as much as possible. These medications can cause confusion.  - Schedule a follow up with your primary doctor, Dr. Althea Charon, in the next couple weeks for a hospital follow up visit.  - You will receive home health physical therapy and a bedside 3 in 1. This will need to be arranged prior to discharge.  - If your symptoms return, or you notice fever, abdominal pain, nausea, vomiting, or diarrhea, seek medical attention right away.     Allergies as of 10/13/2016      Reactions   Doxycycline Anaphylaxis, Rash   Given Epi-pen at [facility]   Sulfa Antibiotics Other (See Comments)   pain      Medication List    STOP taking these medications   azithromycin 250 MG tablet Commonly known as:  ZITHROMAX Z-PAK   predniSONE 5 MG tablet Commonly known as:  DELTASONE     TAKE these medications   acetaminophen 325 MG tablet Commonly known as:   TYLENOL Take 325 mg by mouth every 6 (six) hours as needed for mild pain or moderate pain.   albuterol 108 (90 Base) MCG/ACT inhaler Commonly known as:  PROVENTIL HFA;VENTOLIN HFA Inhale 2 puffs into the lungs every 6 (six) hours as needed for wheezing or shortness of breath.   atorvastatin 40 MG tablet Commonly known as:  LIPITOR Take 1 tablet (40 mg total) by mouth daily.   baclofen 10 MG tablet Commonly known as:  LIORESAL Take 0.5-1 tablets (5-10 mg total) by mouth 3 (three) times daily as needed for muscle spasms.   carbamide peroxide 6.5 % OTIC solution Commonly known as:  EAR WAX REMOVAL DROPS Place 5 drops into both ears 2 (two) times daily. What changed:  when to take this  reasons to take this   finasteride 5 MG tablet Commonly known as:  PROSCAR TAKE ONE (1) TABLET EACH DAY   fluticasone 50 MCG/ACT nasal spray Commonly known as:  FLONASE Place 2 sprays into both nostrils daily. What changed:  when to take this  reasons to take this   Fluticasone-Umeclidin-Vilant 100-62.5-25 MCG/INH Aepb Commonly known as:  TRELEGY ELLIPTA Inhale 1 puff into the lungs daily.   gabapentin 100 MG capsule Commonly known as:  NEURONTIN TAKE ONE (1) CAPSULE THREE (3)  TIMES EACH DAY AS NEEDED FOR NERVE PAIN   HYDROcodone-acetaminophen 5-325 MG tablet Commonly known as:  NORCO/VICODIN Take 0.5 tablets by mouth every 6 (six) hours as needed for moderate pain.   ipratropium 0.06 % nasal spray Commonly known as:  ATROVENT Place 2 sprays into both nostrils 4 (four) times daily. For up to 5-7 days then stop.   Ipratropium-Albuterol 20-100 MCG/ACT Aers respimat Commonly known as:  COMBIVENT Inhale 1 puff into the lungs every 6 (six) hours as needed for wheezing or shortness of breath.   lisinopril 5 MG tablet Commonly known as:  PRINIVIL,ZESTRIL Take 1 tablet (5 mg total) by mouth daily.   LORazepam 1 MG tablet Commonly known as:  ATIVAN Take 0.5-1 tablets (0.5-1 mg total)  by mouth every 8 (eight) hours as needed for anxiety or sleep. What changed:  how much to take   OXYGEN Inhale 3-5 L into the lungs continuous.   SCANDISHAKE Misc Take 1 each by mouth daily.   SILTUSSIN SA 100 MG/5ML syrup Generic drug:  guaifenesin TAKE ONE TO TWO TEASPOONFULS EVERY FOUR HOURS AS NEEDED FOR COUGH EXPECTORANT   tamsulosin 0.4 MG Caps capsule Commonly known as:  FLOMAX Take 2 capsules (0.8 mg total) by mouth at bedtime.   URIBEL 118 MG Caps Take 1 capsule (118 mg total) by mouth QID.      Follow-up Information    Smitty CordsKaramalegos, Alexander J, DO. Schedule an appointment as soon as possible for a visit in 1 week(s).   Specialty:  Family Medicine Contact information: [address] [place] [place] [address] [phone]        Harle BattiestMcGowan, Shannon A, PA-C. Schedule an appointment as soon as possible for a visit in 2 week(s).   Specialties:  Urology, Radiology Contact information: [address] [place] [phone]        [facility] Follow up.   Why:  Home Health Physical Therapy- agency will contact you to arrange initial visit Contact information: [address] [place] [address] [phone]          Allergies  Allergen Reactions  . Doxycycline Anaphylaxis and Rash    [facility]  . Sulfa Antibiotics Other (See Comments)    pain    Consultations:  None  Procedures/Studies: Dg Chest Port 1 View  Result Date: 10/11/2016 CLINICAL DATA:  Urinary frequency and urinary tract infection EXAM: PORTABLE CHEST 1 VIEW COMPARISON:  None. FINDINGS: Heart is normal in size. There is aortic atherosclerosis with uncoiling of the thoracic aorta. Emphysematous hyperinflation of the lungs. Minimal atelectasis at the left lung base. Chronic biapical pleuroparenchymal scarring and minimal thickening. No effusion or pneumothorax. The visualized skeletal structures are unremarkable. IMPRESSION: 1.  Emphysematous hyperinflation of the lungs with biapical pleuroparenchymal scarring and thickening. Left basilar atelectasis is currently identified without effusion or pneumothorax. 2. Uncoiled appearance of the thoracic aorta with atherosclerosis. Electronically Signed   By: Tollie Ethavid  Kwon M.D.   On: 10/11/2016 19:15   Subjective: Pt more alert today, eating well. No complaints of pain, though he feels weak. Continues to be unable to urinate more than 10cc at a time with postvoid residuals >200cc.   Discharge Exam: BP (!) 135/93 (BP Location: Left Arm)   Pulse 72   Temp 98 F (36.7 C) (Oral)   Resp 18   Ht 5\' 9"  (1.753 m)   Wt 54.8 kg (120 lb 13 oz)   SpO2 99%   BMI  17.84 kg/m   General: Chronically ill-appearing male in no distress HEENT: No thrush noted. Cardiovascular: RRR, no murmur, JVD or edema Respiratory: CTA bilaterally on 3L by [place], very distant, without wheezes or crackles. Abdominal: Soft, NT, ND, bowel sounds + Neuro: Alert and oriented x3. No focal deficits. Has severe word blocking and slow to speak. Has goal-directed speech. Impaired short term recall.    Labs: Basic Metabolic Panel:  Recent Labs Lab 10/11/16 1836 10/12/16 0154  NA 136 137  K 4.7 4.2  CL 90* 96*  CO2 33* 32  GLUCOSE 106* 73  BUN 21* 19  CREATININE 1.10 1.03  CALCIUM 10.3 9.2   Liver Function Tests:  Recent Labs Lab 10/11/16 1836 10/12/16 0154  AST 21 18  ALT 14* 12*  ALKPHOS 70 56  BILITOT 1.0 1.2  PROT 7.3 5.8*  ALBUMIN 3.9 3.1*   CBC:  Recent Labs Lab 10/11/16 1836 10/12/16 0154  WBC 12.7* 15.2*  NEUTROABS 11.8*  --   HGB 15.0 12.7*  HCT 44.8 39.2  MCV 89.1 89.3  PLT 265 270   Cardiac Enzymes:  Recent Labs Lab 10/12/16 0154 10/12/16 0807  TROPONINI 0.05* 0.03*   Thyroid function studies No results for input(s): TSH, T4TOTAL, T3FREE, THYROIDAB in the last 72 hours.  Invalid input(s): FREET3 Anemia work up No results for input(s): VITAMINB12, FOLATE, FERRITIN,  TIBC, IRON, RETICCTPCT in the last 72 hours. Urinalysis    Component Value Date/Time   COLORURINE YELLOW 10/11/2016 1836   APPEARANCEUR CLEAR 10/11/2016 1836   APPEARANCEUR Clear 08/21/2015 1030   LABSPEC 1.013 10/11/2016 1836   PHURINE 6.0 10/11/2016 1836   GLUCOSEU NEGATIVE 10/11/2016 1836   HGBUR SMALL (A) 10/11/2016 1836   BILIRUBINUR NEGATIVE 10/11/2016 1836   BILIRUBINUR Negative 08/21/2015 1030   KETONESUR 20 (A) 10/11/2016 1836   PROTEINUR NEGATIVE 10/11/2016 1836   UROBILINOGEN negative 06/22/2015 1345   NITRITE NEGATIVE 10/11/2016 1836   LEUKOCYTESUR NEGATIVE 10/11/2016 1836   LEUKOCYTESUR Negative 08/21/2015 1030    Microbiology Recent Results (from the past 240 hour(s))  Blood Culture (routine x 2)     Status: None   Collection Time: 10/11/16  6:36 PM  Result Value Ref Range Status   Specimen Description BLOOD BLOOD RIGHT FOREARM  Final   Special Requests   Final    BOTTLES DRAWN AEROBIC AND ANAEROBIC Blood Culture adequate volume   Culture   Final    NO GROWTH 5 DAYS Performed at [facility], [address] [place], [place] [address]    Report Status 10/16/2016 FINAL  Final  Urine Culture     Status: None   Collection Time: 10/11/16  6:36 PM  Result Value Ref Range Status   Specimen Description URINE, CATHETERIZED  Final   Special Requests NONE  Final   Culture   Final    NO GROWTH Performed at [facility], [address], [place], [place] [address]    Report Status 10/13/2016 FINAL  Final  Blood Culture (routine x 2)     Status: None   Collection Time: 10/11/16  7:37 PM  Result Value Ref Range Status   Specimen Description BLOOD RIGHT HAND  Final   Special Requests IN PEDIATRIC BOTTLE Blood Culture adequate volume  Final   Culture   Final    NO GROWTH 5 DAYS Performed at [facility], [address], [place], [place] [address]    Report Status 10/16/2016 FINAL  Final  Time coordinating discharge: Approximately 40  minutes  Hazeline Junker, MD  [facility] 10/16/2016, 3:24 PM Pager [phone]

## 2016-10-13 NOTE — Evaluation (Signed)
Physical Therapy Evaluation Patient Details Name: Connor Lewis MRN: [id number] DOB: 10/29/1940 Today's Date: 10/13/2016   History of Present Illness  76 y.o. 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 Pt admitted 10/11/16 for acute toxic metabolic encephalopathy and BPH and acute urinary retention  Clinical Impression  Pt admitted with above diagnosis. Pt currently with functional limitations due to the deficits listed below (see PT Problem List).  Pt will benefit from skilled PT to increase their independence and safety with mobility to allow discharge to the venue listed below.  Upon arrival, pt supine and reported he was waiting for assistance with pericare after having BM in bed.  Pt assisted with pericare by rolling in bed.  Pt then assisted with ambulating short distance in room and overall min/guard for mobility.  Pt fatigues quickly.  Pt's grandson present and reports pt has been on the couch or bed for the past 5 days.  Pt would benefit from HHPT if agreeable.  Pt also requesting BSC for home.     Follow Up Recommendations Home health PT;Supervision for mobility/OOB    Equipment Recommendations  3in1 (PT)    Recommendations for Other Services       Precautions / Restrictions Precautions Precautions: Fall Precaution Comments: chronic O2, monitor sats      Mobility  Bed Mobility Overal bed mobility: Needs Assistance Bed Mobility: Rolling;Sidelying to Sit Rolling: Supervision Sidelying to sit: Supervision       General bed mobility comments: increased time, utilized bed rail  Transfers Overall transfer level: Needs assistance Equipment used: Rolling walker (2 wheeled) Transfers: Sit to/from Stand Sit to Stand: Min guard         General transfer comment: min/guard for safety  Ambulation/Gait Ambulation/Gait assistance: Min guard Ambulation Distance (Feet): 8 Feet Assistive  device: Rolling walker (2 wheeled) Gait Pattern/deviations: Step-through pattern;Decreased stride length     General Gait Details: very slow pace, pt reports 4L at baseline and especially with activity - requested increase to 6L so briefly ambulated on 6L and SPO2 97%, returned to 4L O2 upon sitting in recliner  Stairs            Wheelchair Mobility    Modified Rankin (Stroke Patients Only)       Balance Overall balance assessment: Needs assistance         Standing balance support: Bilateral upper extremity supported Standing balance-Leahy Scale: Poor Standing balance comment: utilizing RW for support, uses rollator at home                             Pertinent Vitals/Pain Pain Assessment: No/denies pain Pain Intervention(s): Repositioned;Monitored during session    Home Living Family/patient expects to be discharged to:: Private residence Living Arrangements: Spouse/significant other;Children (daughter) Available Help at Discharge: Family;Available PRN/intermittently Type of Home: House Home Access: Stairs to enter   [employer] of Steps: 3 Home Layout: One level Home Equipment: Environmental consultant - 4 wheels (rollator)      Prior Function Level of Independence: Independent with assistive device(s)         Comments: short distances with rollator, end stage COPD     Hand Dominance        Extremity/Trunk Assessment        Lower Extremity Assessment Lower Extremity Assessment: Generalized weakness (diffuse muscle atrophy throughout)    Cervical / Trunk Assessment Cervical /  Trunk Assessment: Normal  Communication   Communication: No difficulties  Cognition Arousal/Alertness: Awake/alert Behavior During Therapy: WFL for tasks assessed/performed Overall Cognitive Status: Within Functional Limits for tasks assessed                                        General Comments      Exercises     Assessment/Plan    PT  Assessment Patient needs continued PT services  PT Problem List Decreased mobility;Decreased activity tolerance;Cardiopulmonary status limiting activity;Decreased strength       PT Treatment Interventions DME instruction;Gait training;Therapeutic exercise;Therapeutic activities;Patient/family education;Functional mobility training    PT Goals (Current goals can be found in the Care Plan section)  Acute Rehab PT Goals PT Goal Formulation: With patient Time For Goal Achievement: 10/20/16 Potential to Achieve Goals: Good    Frequency Min 3X/week   Barriers to discharge        Co-evaluation               AM-PAC PT "6 Clicks" Daily Activity  Outcome Measure Difficulty turning over in bed (including adjusting bedclothes, sheets and blankets)?: A Little Difficulty moving from lying on back to sitting on the side of the bed? : A Little Difficulty sitting down on and standing up from a chair with arms (e.g., wheelchair, bedside commode, etc,.)?: A Little Help needed moving to and from a bed to chair (including a wheelchair)?: A Little Help needed walking in hospital room?: A Little Help needed climbing 3-5 steps with a railing? : A Lot 6 Click Score: 17    End of Session Equipment Utilized During Treatment: Gait belt;Oxygen Activity Tolerance: Patient tolerated treatment well Patient left: in chair;with call bell/phone within reach;with family/visitor present   PT Visit Diagnosis: Difficulty in walking, not elsewhere classified (R26.2)    Time: [id number] PT Time Calculation (min) (ACUTE ONLY): 33 min   Charges:   PT Evaluation $PT Eval Low Complexity: 1 Low     PT G Codes:        Zenovia JarredKati Jasun Gasparini, PT, DPT 10/13/2016 Pager: [phone]  Maida SaleLEMYRE,KATHrine E 10/13/2016, 3:05 PM

## 2016-10-13 NOTE — Care Management Note (Signed)
Case Management Note  Patient Details  Name: Connor Lewis MRN: [id number] Date of Birth: 25-Jul-1940  Subjective/Objective:     Acute metabolic encephalopathy, BPH and acute urinary retention               Action/Plan: Discharge Planning:  NCM spoke to pt and gave permission to contact dtr, Connor Lewis. [facility] via phone [facility]. Contacted [facility] rep with new referral. Dtr states pt has RW, bedside commode and oxygen ([employer]) at home. States she will bring his portable oxygen at dc. Pt lives in home with dtr and wife.    PCP Connor Lewis, Connor J MD  Expected Discharge Date:  10/13/16               Expected Discharge Plan:  Home w Home Health Services  In-House Referral:  NA  Discharge planning Services  CM Consult  Post Acute Care Choice:  Home Health Choice offered to:  Adult Children  DME Arranged:  N/A DME Agency:  NA  HH Arranged:  PT HH Agency:  [facility]  Status of Service:  Completed, signed off  If discussed at Long Length of Stay Meetings, dates discussed:    Additional Comments:  Elliot CousinShavis, Gildo Crisco Ellen, RN 10/13/2016, 5:40 PM

## 2016-10-14 ENCOUNTER — Telehealth: Payer: Self-pay

## 2016-10-14 DIAGNOSIS — Z7952 Long term (current) use of systemic steroids: Secondary | ICD-10-CM | POA: Diagnosis not present

## 2016-10-14 DIAGNOSIS — R339 Retention of urine, unspecified: Secondary | ICD-10-CM | POA: Diagnosis not present

## 2016-10-14 DIAGNOSIS — Z87891 Personal history of nicotine dependence: Secondary | ICD-10-CM | POA: Diagnosis not present

## 2016-10-14 DIAGNOSIS — G9341 Metabolic encephalopathy: Secondary | ICD-10-CM | POA: Diagnosis not present

## 2016-10-14 DIAGNOSIS — Z960 Presence of urogenital implants: Secondary | ICD-10-CM | POA: Diagnosis not present

## 2016-10-14 DIAGNOSIS — I10 Essential (primary) hypertension: Secondary | ICD-10-CM | POA: Diagnosis not present

## 2016-10-14 DIAGNOSIS — J9611 Chronic respiratory failure with hypoxia: Secondary | ICD-10-CM | POA: Diagnosis not present

## 2016-10-14 DIAGNOSIS — J9612 Chronic respiratory failure with hypercapnia: Secondary | ICD-10-CM | POA: Diagnosis not present

## 2016-10-14 DIAGNOSIS — Z9981 Dependence on supplemental oxygen: Secondary | ICD-10-CM | POA: Diagnosis not present

## 2016-10-14 DIAGNOSIS — J441 Chronic obstructive pulmonary disease with (acute) exacerbation: Secondary | ICD-10-CM | POA: Diagnosis not present

## 2016-10-14 DIAGNOSIS — G629 Polyneuropathy, unspecified: Secondary | ICD-10-CM | POA: Diagnosis not present

## 2016-10-14 DIAGNOSIS — F419 Anxiety disorder, unspecified: Secondary | ICD-10-CM | POA: Diagnosis not present

## 2016-10-14 DIAGNOSIS — R262 Difficulty in walking, not elsewhere classified: Secondary | ICD-10-CM | POA: Diagnosis not present

## 2016-10-14 DIAGNOSIS — N401 Enlarged prostate with lower urinary tract symptoms: Secondary | ICD-10-CM | POA: Diagnosis not present

## 2016-10-14 NOTE — Telephone Encounter (Signed)
Called for TCM call, spoke with patients daughter who preferred a call back at a later time. Will call patient back this afternoon.

## 2016-10-15 ENCOUNTER — Other Ambulatory Visit: Payer: Self-pay | Admitting: Family Medicine

## 2016-10-15 ENCOUNTER — Telehealth: Payer: Self-pay | Admitting: Pulmonary Disease

## 2016-10-15 DIAGNOSIS — J449 Chronic obstructive pulmonary disease, unspecified: Secondary | ICD-10-CM

## 2016-10-15 MED ORDER — PREDNISONE 20 MG PO TABS: 20.0000 mg | ORAL_TABLET | Freq: Every day | ORAL | 0 refills | Status: DC

## 2016-10-15 NOTE — Telephone Encounter (Signed)
Transition Care Management Follow-up Telephone Call  Spoke with patients daughterZannie Cove- Lisha   Date discharged? 10/12/2016  How have you been since you were released from the hospital? Fairly well, was evaluated for home health yesterday. Still having a problem walking d/t catheter.   Do you understand why you were in the hospital? Yes  Do you understand the discharge instructions? Yes  Where were you discharged to? Home   Items Reviewed: Medications reviewed: YES Allergies reviewed: YES Dietary changes reviewed: YES Referrals reviewed: YES   Functional Questionnaire:  Activities of Daily Living (ADLs):   states they are independent in the following: feeding.  States they require assistance with the following: showering, dressing, ambulation   Any transportation issues/concerns?: no  Any patient concerns?  Yes, concerned about the Ativan- having side effects of moodiness and anger/ Daughter doesn't believe she can bring him in within the 7 days. She can bring him within 14 days.  Will be out of prednisone before next appt, daughter is requesting a refill before they come in.  Needs advanced directive packet at next visit.   Confirmed importance and date/time of follow-up visits scheduled: Yes Provider Appointment booked with Dr.Karamalegos on 10/23/2016 at 2:20pm   Confirmed with patient if condition begins to worsen call PCP or go to the ER.  Patient was given the office number and encouraged to call back with question or concerns.  : YES

## 2016-10-15 NOTE — Telephone Encounter (Signed)
Pt requesting a refill on Prednisone. Please see the attached message.

## 2016-10-15 NOTE — Telephone Encounter (Signed)
I have made the 2nd attempt to contact the patient or family member in charge, in order to follow up from recently being discharged from the hospital. No voicemail box is set-up,  but I will make another attempt at a different time.

## 2016-10-15 NOTE — Telephone Encounter (Signed)
Daughter calling for refill on Prednisone    Dc hospital on Sunday with Cath. May not be able to travel for ov   Patient takes prednisone 20 mg po once daily Daughter stated he has not been able to taper dosage at all the one tablet has been needed .   Please call to discuss refill

## 2016-10-15 NOTE — Telephone Encounter (Signed)
Pt was released for Connor Lewis on Friday and was instructed to schedule follow up appt but Connor Lewis said he probably isn't mobile enough to come to appt.  He has a catheter and isn't schedule to go to urology until Aug 29th.  He needs a refill on prednisone sent to Connor Lewis.  Her call back number is Connor Lewis

## 2016-10-15 NOTE — Telephone Encounter (Signed)
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 This refill request should be sent to Pulmonology for further review, and it appears they have already been notified by patient. Given the patient was recently hospitalized and will run out of current rx before next apt, I have went ahead and sent #30 pills for prednisone 20mg  daily. Will forward note to Pulmonology.  Saralyn PilarAlexander Karamalegos, DO [facility] 10/15/2016, 5:46 PM

## 2016-10-15 NOTE — Telephone Encounter (Signed)
Tried to call [facility] and no answer and no VM available.

## 2016-10-15 NOTE — Telephone Encounter (Signed)
2nd attempt to contact patient's daughter. No vmail.

## 2016-10-16 LAB — CULTURE, BLOOD (ROUTINE X 2)
CULTURE: NO GROWTH
Culture: NO GROWTH
Special Requests: ADEQUATE
Special Requests: ADEQUATE

## 2016-10-16 MED ORDER — PREDNISONE 20 MG PO TABS: 20.0000 mg | ORAL_TABLET | Freq: Every day | ORAL | 0 refills | Status: AC

## 2016-10-16 NOTE — Telephone Encounter (Signed)
The pt daughter was notified and she informed me that the pt currently have a Pulmonologist Appt schedule.

## 2016-10-16 NOTE — Telephone Encounter (Signed)
Patient's daughter aware needs appointment before any future refill on Prednisone.

## 2016-10-17 DIAGNOSIS — R262 Difficulty in walking, not elsewhere classified: Secondary | ICD-10-CM | POA: Diagnosis not present

## 2016-10-17 DIAGNOSIS — I10 Essential (primary) hypertension: Secondary | ICD-10-CM | POA: Diagnosis not present

## 2016-10-17 DIAGNOSIS — J441 Chronic obstructive pulmonary disease with (acute) exacerbation: Secondary | ICD-10-CM | POA: Diagnosis not present

## 2016-10-17 DIAGNOSIS — G629 Polyneuropathy, unspecified: Secondary | ICD-10-CM | POA: Diagnosis not present

## 2016-10-17 DIAGNOSIS — G9341 Metabolic encephalopathy: Secondary | ICD-10-CM | POA: Diagnosis not present

## 2016-10-17 DIAGNOSIS — F419 Anxiety disorder, unspecified: Secondary | ICD-10-CM | POA: Diagnosis not present

## 2016-10-22 DIAGNOSIS — F419 Anxiety disorder, unspecified: Secondary | ICD-10-CM | POA: Diagnosis not present

## 2016-10-22 DIAGNOSIS — G9341 Metabolic encephalopathy: Secondary | ICD-10-CM | POA: Diagnosis not present

## 2016-10-22 DIAGNOSIS — I10 Essential (primary) hypertension: Secondary | ICD-10-CM | POA: Diagnosis not present

## 2016-10-22 DIAGNOSIS — R262 Difficulty in walking, not elsewhere classified: Secondary | ICD-10-CM | POA: Diagnosis not present

## 2016-10-22 DIAGNOSIS — J441 Chronic obstructive pulmonary disease with (acute) exacerbation: Secondary | ICD-10-CM | POA: Diagnosis not present

## 2016-10-22 DIAGNOSIS — G629 Polyneuropathy, unspecified: Secondary | ICD-10-CM | POA: Diagnosis not present

## 2016-10-23 ENCOUNTER — Inpatient Hospital Stay: Payer: Self-pay | Admitting: Family Medicine

## 2016-10-24 DIAGNOSIS — I10 Essential (primary) hypertension: Secondary | ICD-10-CM | POA: Diagnosis not present

## 2016-10-24 DIAGNOSIS — G9341 Metabolic encephalopathy: Secondary | ICD-10-CM | POA: Diagnosis not present

## 2016-10-24 DIAGNOSIS — J441 Chronic obstructive pulmonary disease with (acute) exacerbation: Secondary | ICD-10-CM | POA: Diagnosis not present

## 2016-10-24 DIAGNOSIS — F419 Anxiety disorder, unspecified: Secondary | ICD-10-CM | POA: Diagnosis not present

## 2016-10-24 DIAGNOSIS — G629 Polyneuropathy, unspecified: Secondary | ICD-10-CM | POA: Diagnosis not present

## 2016-10-24 DIAGNOSIS — R262 Difficulty in walking, not elsewhere classified: Secondary | ICD-10-CM | POA: Diagnosis not present

## 2016-10-29 NOTE — Progress Notes (Signed)
10:13 AM   Jerry Caras [birth year] [id number]  Referring provider: Smitty Cords, DO [address] [place], [place] [address]  Chief Complaint  Patient presents with  . Urinary Retention    void and trial    HPI: Patient is a 76 year old Caucasian male who presents for a follow up from the hospital.  He was found to be in urinary retention during a recent hospitilization for delirium.  A Foley was placed and 1 L of urine was returned.   He failed a TOV prior to discharge and was sent home with the Foley.    He has a prior history of urinary retention and was on tamsulosin and finasteride.  His previous IPSS score was 9/2.  His PVR was 177 mL. He continues to take the tamsulosin and finasteride.    Today, he is complaining of dysuria. He is not having suprapubic pain or gross hematuria. He is not having fevers, chills, nausea or vomiting.    PMH: Past Medical History:  Diagnosis Date  . Anxiety and depression   . Chronic bronchiolitis (HCC)   . 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 Diverticulitis   . Urinary retention   . Urine incontinence     Surgical History: Past Surgical History:  Procedure Laterality Date  . none      Home Medications:  Allergies as of 10/30/2016      Reactions   Doxycycline Anaphylaxis, Rash   Given Epi-pen at [facility]   Sulfa Antibiotics Other (See Comments)   pain      Medication List       Accurate as of 10/30/16 10:13 AM. Always use your most recent med list.          acetaminophen 325 MG tablet Commonly known as:  TYLENOL Take 325 mg by mouth every 6 (six) hours as needed for mild pain or moderate pain.   albuterol 108 (90 Base) MCG/ACT inhaler Commonly known as:  PROVENTIL HFA;VENTOLIN HFA Inhale 2 puffs into the lungs every 6 (six) hours as needed for wheezing or shortness of breath.   atorvastatin 40 MG tablet Commonly known as:  LIPITOR Take 1 tablet (40 mg total) by mouth daily.     baclofen 10 MG tablet Commonly known as:  LIORESAL Take 0.5-1 tablets (5-10 mg total) by mouth 3 (three) times daily as needed for muscle spasms.   carbamide peroxide 6.5 % OTIC solution Commonly known as:  EAR WAX REMOVAL DROPS Place 5 drops into both ears 2 (two) times daily.   finasteride 5 MG tablet Commonly known as:  PROSCAR TAKE ONE (1) TABLET EACH DAY   fluticasone 50 MCG/ACT nasal spray Commonly known as:  FLONASE Place 2 sprays into both nostrils daily.   Fluticasone-Umeclidin-Vilant 100-62.5-25 MCG/INH Aepb Commonly known as:  TRELEGY ELLIPTA Inhale 1 puff into the lungs daily.   gabapentin 100 MG capsule Commonly known as:  NEURONTIN TAKE ONE (1) CAPSULE THREE (3) TIMES EACH DAY AS NEEDED FOR NERVE PAIN   HYDROcodone-acetaminophen 5-325 MG tablet Commonly known as:  NORCO/VICODIN Take 0.5 tablets by mouth every 6 (six) hours as needed for moderate pain.   ipratropium 0.06 % nasal spray Commonly known as:  ATROVENT Place 2 sprays into both nostrils 4 (four) times daily. For up to 5-7 days then stop.   Ipratropium-Albuterol 20-100 MCG/ACT Aers respimat Commonly known as:  COMBIVENT Inhale 1 puff into the lungs every 6 (six) hours  as needed for wheezing or shortness of breath.   lisinopril 5 MG tablet Commonly known as:  PRINIVIL,ZESTRIL Take 1 tablet (5 mg total) by mouth daily.   LORazepam 1 MG tablet Commonly known as:  ATIVAN Take 0.5-1 tablets (0.5-1 mg total) by mouth every 8 (eight) hours as needed for anxiety or sleep.   OXYGEN Inhale 3-5 L into the lungs continuous.   predniSONE 20 MG tablet Commonly known as:  DELTASONE Take 1 tablet (20 mg total) by mouth daily.   SCANDISHAKE Misc Take 1 each by mouth daily.   SILTUSSIN SA 100 MG/5ML syrup Generic drug:  guaifenesin TAKE ONE TO TWO TEASPOONFULS EVERY FOUR HOURS AS NEEDED FOR COUGH EXPECTORANT   tamsulosin 0.4 MG Caps capsule Commonly known as:  FLOMAX Take 2 capsules (0.8 mg total) by  mouth at bedtime.   URIBEL 118 MG Caps Take 1 capsule (118 mg total) by mouth QID.       Allergies:  Allergies  Allergen Reactions  . Doxycycline Anaphylaxis and Rash    Given Epi-pen at [facility]  . Sulfa Antibiotics Other (See Comments)    pain    Family History: Family History  Problem Relation Age of Onset  . Stroke Mother   . Diabetes Mother   . Heart disease Father   . Kidney disease Unknown        mothers side  . Prostate cancer Neg Hx   . Kidney cancer Neg Hx   . Bladder Cancer Neg Hx     Social History:  reports that he has quit smoking. He has a 56.00 pack-year smoking history. He has quit using smokeless tobacco. He reports that he drinks alcohol. He reports that he does not use drugs.  ROS: UROLOGY Frequent Urination?: No Hard to postpone urination?: No Burning/pain with urination?: Yes Get up at night to urinate?: No Leakage of urine?: No Urine stream starts and stops?: No Trouble starting stream?: No Do you have to strain to urinate?: No Blood in urine?: No Urinary tract infection?: No Sexually transmitted disease?: No Injury to kidneys or bladder?: No Painful intercourse?: No Weak stream?: No Erection problems?: No Penile pain?: No  Gastrointestinal Nausea?: No Vomiting?: No Indigestion/heartburn?: No Diarrhea?: No Constipation?: No  Constitutional Fever: No Night sweats?: No Weight loss?: No Fatigue?: No  Skin Skin rash/lesions?: No Itching?: No  Eyes Blurred vision?: No Double vision?: No  Ears/Nose/Throat Sore throat?: No Sinus problems?: No  Hematologic/Lymphatic Swollen glands?: No Easy bruising?: No  Cardiovascular Leg swelling?: No Chest pain?: No  Respiratory Cough?: No Shortness of breath?: Yes  Endocrine Excessive thirst?: No  Musculoskeletal Back pain?: No Joint pain?: No  Neurological Headaches?: No Dizziness?: No  Psychologic Depression?: No Anxiety?: No  Physical Exam: BP 129/82   Pulse (!)  104   Ht 5\' 9"  (1.753 m)   Wt 113 lb (51.3 kg)   BMI 16.69 kg/m   Constitutional: Well nourished. Alert and oriented, No acute distress. HEENT: [place] AT, moist mucus membranes. Trachea midline, no masses. Cardiovascular: No clubbing, cyanosis, or edema. Respiratory: Normal respiratory effort, no increased work of breathing. Skin: No rashes, bruises or suspicious lesions. Lymph: No cervical or inguinal adenopathy. Neurologic: Grossly intact, no focal deficits, moving all 4 extremities. Psychiatric: Normal mood and affect.  Laboratory Data: Lab Results  Component Value Date   WBC 15.2 (H) 10/12/2016   HGB 12.7 (L) 10/12/2016   HCT 39.2 10/12/2016   MCV 89.3 10/12/2016   PLT 270 10/12/2016  Lab Results  Component Value Date   CREATININE 1.03 10/12/2016     Lab Results  Component Value Date   TSH 0.872 10/13/2016     Lab Results  Component Value Date   AST 18 10/12/2016   Lab Results  Component Value Date   ALT 12 (L) 10/12/2016   I have reviewed the labs  Assessment & Plan:    1. Acute urinary retention:     - foley catheter removed  -voiding trial today    -return if unable to urinate or experiencing suprapubic discomfort  -follow-up in one month for I PSS score, PVR and exam.  2. BPH with LUTS:     - Continue tamsulosin 0.4 mg daily and add finasteride 5 mg daily  - RTC in 1 months for IPSS and PVR   Return in about 1 month (around 11/30/2016) for I PSS and PVR.  These notes generated with voice recognition software. I apologize for typographical errors.  Michiel Cowboy, PA-C  [facility] [address] [place], [place] [address] [phone]

## 2016-10-30 ENCOUNTER — Ambulatory Visit (INDEPENDENT_AMBULATORY_CARE_PROVIDER_SITE_OTHER): Payer: Medicare Other | Admitting: Urology

## 2016-10-30 ENCOUNTER — Emergency Department
Admission: EM | Admit: 2016-10-30 | Discharge: 2016-10-31 | Disposition: A | Discharge status: Home / Self Care | Payer: Medicare Other | Attending: Emergency Medicine | Admitting: Emergency Medicine

## 2016-10-30 ENCOUNTER — Encounter: Payer: Self-pay | Admitting: Urology

## 2016-10-30 VITALS — BP 129/82 | HR 104 | Ht 69.0 in | Wt 113.0 lb

## 2016-10-30 DIAGNOSIS — J449 Chronic obstructive pulmonary disease, unspecified: Secondary | ICD-10-CM | POA: Diagnosis not present

## 2016-10-30 DIAGNOSIS — N401 Enlarged prostate with lower urinary tract symptoms: Secondary | ICD-10-CM | POA: Diagnosis not present

## 2016-10-30 DIAGNOSIS — N138 Other obstructive and reflux uropathy: Secondary | ICD-10-CM | POA: Diagnosis not present

## 2016-10-30 DIAGNOSIS — Z87891 Personal history of nicotine dependence: Secondary | ICD-10-CM | POA: Diagnosis not present

## 2016-10-30 DIAGNOSIS — Z79899 Other long term (current) drug therapy: Secondary | ICD-10-CM | POA: Insufficient documentation

## 2016-10-30 DIAGNOSIS — R339 Retention of urine, unspecified: Secondary | ICD-10-CM

## 2016-10-30 DIAGNOSIS — I10 Essential (primary) hypertension: Secondary | ICD-10-CM | POA: Insufficient documentation

## 2016-10-30 DIAGNOSIS — G8929 Other chronic pain: Secondary | ICD-10-CM | POA: Diagnosis not present

## 2016-10-30 DIAGNOSIS — R509 Fever, unspecified: Secondary | ICD-10-CM | POA: Diagnosis not present

## 2016-10-30 DIAGNOSIS — R338 Other retention of urine: Secondary | ICD-10-CM | POA: Diagnosis not present

## 2016-10-30 NOTE — ED Triage Notes (Addendum)
Pt arrives via [facility] from home with c/o urinary retention. Per EMS pt had his catheter removed today and had one episode of being able to urinate but it was just a trickle. Pt states his abdomen and penis hurts. Pt is moaning and states pain 10/10. Per EMS VS BP-152/86, HR 92, and pt is currently wears 4L of O2 at home. Pt is A&OX4

## 2016-10-30 NOTE — ED Notes (Signed)
Pt bladder scanned by EDT [facility] Volume= [id number]

## 2016-10-30 NOTE — Progress Notes (Signed)
Catheter Removal  Patient is present today for a catheter removal.  8ml of water was drained from the balloon. A 14FR foley cath was removed from the bladder no complications were noted . Patient tolerated well.  Preformed by: Dallas Schimkeamona Margrit Minner CMA

## 2016-10-31 LAB — URINALYSIS, COMPLETE (UACMP) WITH MICROSCOPIC
Bilirubin Urine: NEGATIVE
Glucose, UA: NEGATIVE mg/dL
KETONES UR: NEGATIVE mg/dL
Nitrite: NEGATIVE
PROTEIN: NEGATIVE mg/dL
SQUAMOUS EPITHELIAL / LPF: NONE SEEN
Specific Gravity, Urine: 1.009 (ref 1.005–1.030)
pH: 7 (ref 5.0–8.0)

## 2016-10-31 LAB — BASIC METABOLIC PANEL
ANION GAP: 10 (ref 5–15)
BUN: 13 mg/dL (ref 6–20)
CHLORIDE: 91 mmol/L — AB (ref 101–111)
CO2: 30 mmol/L (ref 22–32)
Calcium: 9 mg/dL (ref 8.9–10.3)
Creatinine, Ser: 0.87 mg/dL (ref 0.61–1.24)
GFR calc Af Amer: >60 mL/min (ref >60)
GFR calc non Af Amer: >60 mL/min (ref >60)
GLUCOSE: 117 mg/dL — AB (ref 65–99)
POTASSIUM: 4.4 mmol/L (ref 3.5–5.1)
Sodium: 131 mmol/L — ABNORMAL LOW (ref 135–145)

## 2016-10-31 LAB — CBC
HEMATOCRIT: 39.8 % — AB (ref 40.0–52.0)
HEMOGLOBIN: 13.5 g/dL (ref 13.0–18.0)
MCH: 29.2 pg (ref 26.0–34.0)
MCHC: 33.9 g/dL (ref 32.0–36.0)
MCV: 86.3 fL (ref 80.0–100.0)
Platelets: 352 10*3/uL (ref 150–440)
RBC: 4.62 MIL/uL (ref 4.40–5.90)
RDW: 13.8 % (ref 11.5–14.5)
WBC: 18.2 10*3/uL — AB (ref 3.8–10.6)

## 2016-10-31 NOTE — ED Notes (Signed)
Pt's daughter called and is on her way to pick up pt.

## 2016-10-31 NOTE — ED Notes (Signed)
Pt currently sleeping comfortably.

## 2016-10-31 NOTE — ED Notes (Signed)
Pt had large BM. Pt cleaned up and linens changed. Pt is now resting comfortably at this time. Pt states he feels so much better since we inserted foley and urine has been drained.

## 2016-10-31 NOTE — ED Provider Notes (Signed)
[facility] Emergency Department Provider Note   ____________________________________________   First MD Initiated Contact with Patient 10/30/16 2348     (approximate)  I have reviewed the triage vital signs and the nursing notes.   HISTORY  Chief Complaint Urinary Retention    HPI Connor Lewis is a 76 y.o. male who comes into the hospital today with lower abdominal pain. The patient reports that he is unable to urinate. He last urinated a little bit once today. The patient had a Foley catheter for 3 weeks and it was removed today at the urologists office. He reports that he urinated a small amount and he wasn't sure if he emptied. The patient states that he tried again to urinate at 3 PM but he didn't have much luck. He thought he would give it a little bit longer but he continued to be unable to urinate as the day went on. The patient states his pain is currently a 9 out of 10 in intensity. He denies any nausea or vomiting.He denies any other complaints at this time.   Past Medical History:  Diagnosis Date  . Anxiety and depression   . Chronic bronchiolitis (HCC)   . 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 Diverticulitis   . Urinary retention   . Urine incontinence     Patient Active Problem List   Diagnosis Date Noted  . Encephalopathy 10/12/2016  . Pressure injury of skin 10/12/2016  . Oral candidiasis   . Urge incontinence of urine 05/07/2016  . Incontinence of feces with fecal urgency 05/07/2016  . Anxiety 03/25/2016  . Insomnia 03/25/2016  . Hypertension 03/25/2016  . Palliative care patient 01/11/2016  . Chronic right-sided back pain 01/11/2016  . 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 Recurrent URI (upper respiratory infection) 09/14/2015  . Subclinical hypothyroidism 09/12/2015  . Patient underweight 09/12/2015  . Acute urinary retention 08/18/2015  . BPH with obstruction/lower urinary tract symptoms  08/18/2015  . Urinary retention 08/10/2015  . Chronic respiratory failure with hypoxia, on home oxygen therapy (HCC) 08/04/2015  . Hypoxia, sleep related 07/06/2015  . Allergic rhinitis 05/11/2015  . Prediabetes 05/11/2015  . Chronic bronchitis (HCC) 04/07/2015  . Peripheral neuropathy 04/07/2015    Past Surgical History:  Procedure Laterality Date  . none      Prior to Admission medications   Medication Sig Start Date End Date Taking? Authorizing Provider  acetaminophen (TYLENOL) 325 MG tablet Take 325 mg by mouth every 6 (six) hours as needed for mild pain or moderate pain.    [provider]  albuterol (PROVENTIL HFA;VENTOLIN HFA) 108 (90 Base) MCG/ACT inhaler Inhale 2 puffs into the lungs every 6 (six) hours as needed for wheezing or shortness of breath. 07/06/15   Krebs, Laurel DimmerAmy Lauren, NP  atorvastatin (LIPITOR) 40 MG tablet Take 1 tablet (40 mg total) by mouth daily. 02/29/16   Karamalegos, Netta NeatAlexander J, DO  baclofen (LIORESAL) 10 MG tablet Take 0.5-1 tablets (5-10 mg total) by mouth 3 (three) times daily as needed for muscle spasms. 05/07/16   Karamalegos, Netta NeatAlexander J, DO  carbamide peroxide (EAR WAX REMOVAL DROPS) 6.5 % otic solution Place 5 drops into both ears 2 (two) times daily. Patient taking differently: Place 5 drops into both ears 2 (two) times daily as needed (ear wax).  05/11/15   Loura PardonKrebs, Amy Lauren, NP  finasteride (PROSCAR) 5 MG tablet TAKE ONE (1) TABLET EACH DAY 08/13/16   Vanna ScotlandBrandon, Ashley, MD  fluticasone (FLONASE) 50 MCG/ACT nasal spray Place 2 sprays into both nostrils daily. Patient taking differently: Place 2 sprays into both nostrils daily as needed for allergies or rhinitis.  03/25/16   Karamalegos, Netta Neat, DO  Fluticasone-Umeclidin-Vilant (TRELEGY ELLIPTA) 100-62.5-25 MCG/INH AEPB Inhale 1 puff into the lungs daily. 05/20/16   Merwyn Katos, MD  gabapentin (NEURONTIN) 100 MG capsule TAKE ONE (1) CAPSULE THREE (3) TIMES EACH DAY AS NEEDED FOR NERVE PAIN  08/06/16   Smitty Cords, DO  HYDROcodone-acetaminophen (NORCO/VICODIN) 5-325 MG tablet Take 0.5 tablets by mouth every 6 (six) hours as needed for moderate pain.    [provider]  ipratropium (ATROVENT) 0.06 % nasal spray Place 2 sprays into both nostrils 4 (four) times daily. For up to 5-7 days then stop. 09/13/16   Karamalegos, Netta Neat, DO  Ipratropium-Albuterol (COMBIVENT) 20-100 MCG/ACT AERS respimat Inhale 1 puff into the lungs every 6 (six) hours as needed for wheezing or shortness of breath. 03/25/16   Karamalegos, Netta Neat, DO  lisinopril (PRINIVIL,ZESTRIL) 5 MG tablet Take 1 tablet (5 mg total) by mouth daily. 03/25/16   Karamalegos, Netta Neat, DO  LORazepam (ATIVAN) 1 MG tablet Take 0.5-1 tablets (0.5-1 mg total) by mouth every 8 (eight) hours as needed for anxiety or sleep. Patient taking differently: Take 0.5 mg by mouth every 8 (eight) hours as needed for anxiety or sleep.  09/13/16   Karamalegos, Netta Neat, DO  Meth-Hyo-M Bl-Na Phos-Ph Sal (URIBEL) 118 MG CAPS Take 1 capsule (118 mg total) by mouth QID. Patient not taking: Reported on 10/11/2016 08/30/15   Michiel Cowboy A, PA-C  Nutritional Supplements Sheila Oats) MISC Take 1 each by mouth daily. 08/10/15   Loura Pardon, NP  OXYGEN Inhale 3-5 L into the lungs continuous.     [provider]  predniSONE (DELTASONE) 20 MG tablet Take 1 tablet (20 mg total) by mouth daily. 10/16/16   Merwyn Katos, MD  SILTUSSIN SA 100 MG/5ML syrup TAKE ONE TO TWO TEASPOONFULS EVERY FOUR HOURS AS NEEDED FOR COUGH EXPECTORANT 09/09/16   Karamalegos, Netta Neat, DO  tamsulosin (FLOMAX) 0.4 MG CAPS capsule Take 2 capsules (0.8 mg total) by mouth at bedtime. 03/25/16   Karamalegos, Netta Neat, DO    Allergies Doxycycline and Sulfa antibiotics  Family History  Problem Relation Age of Onset  . Stroke Mother   . Diabetes Mother   . Heart disease Father   . Kidney disease Unknown        mothers side  . Prostate  cancer Neg Hx   . Kidney cancer Neg Hx   . Bladder Cancer Neg Hx     Social History Social History  Substance Use Topics  . Smoking status: Former Smoker    Packs/day: 1.00    Years: 56.00  . Smokeless tobacco: Former Neurosurgeon     Comment: quit in 2015  . Alcohol use 0.0 oz/week     Comment: occ    Review of Systems  Constitutional: No fever/chills Eyes: No visual changes. ENT: No sore throat. Cardiovascular: Denies chest pain. Respiratory: Denies shortness of breath. Gastrointestinal: No abdominal pain.  No nausea, no vomiting.  No diarrhea.  No constipation. Genitourinary: urinary retention Musculoskeletal: Negative for back pain. Skin: Negative for rash. Neurological: Negative for headaches, focal weakness or numbness.   ____________________________________________   PHYSICAL EXAM:  VITAL SIGNS: ED Triage Vitals  Enc Vitals Group     BP 10/30/16 2333 124/90     Pulse Rate 10/30/16  2333 (!) 109     Resp 10/30/16 2333 18     Temp 10/30/16 2333 98 F (36.7 C)     Temp Source 10/30/16 2333 Oral     SpO2 10/30/16 2333 97 %     Weight 10/30/16 2328 113 lb (51.3 kg)     Height 10/30/16 2328 5\' 9"  (1.753 m)     Head Circumference --      Peak Flow --      Pain Score 10/30/16 2327 10     Pain Loc --      Pain Edu? --      Excl. in GC? --     Constitutional: Alert and oriented. Well appearing and in moderate distress. Eyes: Conjunctivae are normal. PERRL. EOMI. Head: Atraumatic. Nose: No congestion/rhinnorhea. Mouth/Throat: Mucous membranes are moist.  Oropharynx non-erythematous. Cardiovascular: Normal rate, regular rhythm. Grossly normal heart sounds.  Good peripheral circulation. Respiratory: Normal respiratory effort.  No retractions. Lungs CTAB. Gastrointestinal: Soft and nontender. No distention. Positive bowel sounds Musculoskeletal: No lower extremity tenderness nor edema.   Neurologic:  Normal speech and language.  Skin:  Skin is warm, dry and intact.    Psychiatric: Mood and affect are normal.   ____________________________________________   LABS (all labs ordered are listed, but only abnormal results are displayed)  Labs Reviewed  CBC - Abnormal; Notable for the following:       Result Value   WBC 18.2 (*)    HCT 39.8 (*)    All other components within normal limits  BASIC METABOLIC PANEL - Abnormal; Notable for the following:    Sodium 131 (*)    Chloride 91 (*)    Glucose, Bld 117 (*)    All other components within normal limits  URINALYSIS, COMPLETE (UACMP) WITH MICROSCOPIC - Abnormal; Notable for the following:    Color, Urine YELLOW (*)    APPearance HAZY (*)    Hgb urine dipstick MODERATE (*)    Leukocytes, UA SMALL (*)    Bacteria, UA RARE (*)    All other components within normal limits   ____________________________________________  EKG  none ____________________________________________  RADIOLOGY  No results found.  ____________________________________________   PROCEDURES  Procedure(s) performed: None  Procedures  Critical Care performed: No  ____________________________________________   INITIAL IMPRESSION / ASSESSMENT AND PLAN / ED COURSE  Pertinent labs & imaging results that were available during my care of the patient were reviewed by me and considered in my medical decision making (see chart for details).  This is a 76 year old male who comes into the hospital today with urinary retention. The patient had a catheter removed earlier today. The patient had a bladder scan that showed over 900 mL of urine in his bladder. We did place a catheter and the patient had immediate relief. The patient's urine shows too numerous to count red blood cells with rare bacteria. His white blood cell count is 18.2 but earlier in August his white blood cell count was 15.2. Given that his hemoglobin, hematocrit and platelets are also elevated I feel that this white count is spurious and is due to  hemoconcentration. The patient has no other complaints. He will be discharged to home with his Foley catheter to follow-up with urology.      ____________________________________________   FINAL CLINICAL IMPRESSION(S) / ED DIAGNOSES  Final diagnoses:  Urinary retention      NEW MEDICATIONS STARTED DURING THIS VISIT:  New Prescriptions   No medications on file  Note:  This document was prepared using Dragon voice recognition software and may include unintentional dictation errors.    Rebecka Apley, MD 10/31/16 [phone]

## 2016-11-01 ENCOUNTER — Ambulatory Visit: Payer: Medicare Other | Admitting: Pulmonary Disease

## 2016-11-01 DIAGNOSIS — R262 Difficulty in walking, not elsewhere classified: Secondary | ICD-10-CM | POA: Diagnosis not present

## 2016-11-01 DIAGNOSIS — G629 Polyneuropathy, unspecified: Secondary | ICD-10-CM | POA: Diagnosis not present

## 2016-11-01 DIAGNOSIS — J441 Chronic obstructive pulmonary disease with (acute) exacerbation: Secondary | ICD-10-CM | POA: Diagnosis not present

## 2016-11-01 DIAGNOSIS — G9341 Metabolic encephalopathy: Secondary | ICD-10-CM | POA: Diagnosis not present

## 2016-11-01 DIAGNOSIS — F419 Anxiety disorder, unspecified: Secondary | ICD-10-CM | POA: Diagnosis not present

## 2016-11-01 DIAGNOSIS — I10 Essential (primary) hypertension: Secondary | ICD-10-CM | POA: Diagnosis not present

## 2016-11-04 DIAGNOSIS — R262 Difficulty in walking, not elsewhere classified: Secondary | ICD-10-CM | POA: Diagnosis not present

## 2016-11-04 DIAGNOSIS — J441 Chronic obstructive pulmonary disease with (acute) exacerbation: Secondary | ICD-10-CM | POA: Diagnosis not present

## 2016-11-04 DIAGNOSIS — F419 Anxiety disorder, unspecified: Secondary | ICD-10-CM | POA: Diagnosis not present

## 2016-11-04 DIAGNOSIS — G629 Polyneuropathy, unspecified: Secondary | ICD-10-CM | POA: Diagnosis not present

## 2016-11-04 DIAGNOSIS — I10 Essential (primary) hypertension: Secondary | ICD-10-CM | POA: Diagnosis not present

## 2016-11-04 DIAGNOSIS — G9341 Metabolic encephalopathy: Secondary | ICD-10-CM | POA: Diagnosis not present

## 2016-11-05 ENCOUNTER — Telehealth: Payer: Self-pay | Admitting: Family Medicine

## 2016-11-05 ENCOUNTER — Telehealth: Payer: Self-pay

## 2016-11-05 NOTE — Telephone Encounter (Signed)
Acknowledged, reviewed hospital ED record, and discharge. Concern with low BP, however need to improve hydration and continue to monitor, if persistent and symptomatic needs to follow-up sooner.  Other telephone conversation regarding concern for UTI. Reviewed ED UA result not entirely consistent with UTI, did not see urine culture.  He is scheduled to see [facility] 11/26/16.  I would recommend patient be seen more urgently if not improving over next 24-48 hours for possible UTI, prefer patient to follow-up with Urology because he has the indwelling foley catheter, otherwise we could see him here if needed.  Saralyn PilarAlexander Karamalegos, DO [facility] 11/05/2016, 1:14 PM

## 2016-11-05 NOTE — Telephone Encounter (Signed)
Connor Lewis  [facility]  [facility] called  [phone],states the pt BP was ;ow on  Friday, went to house on Monday BP was 88/52 .Marland Kitchen. Need  (verbal)  Approval for missed appt.due to pt was in ER. Connor Lewis call back # is  [phone]

## 2016-11-05 NOTE — Telephone Encounter (Addendum)
I spoke w/ Connor Lewis from home health and she informed me that the pt was recently seen in the hospital for recurrent urinary retentionl after he had his catheter removed he went back into urinary retention. The catheter was replaced. Home visit was done yesterday  and his blood pressure was 88/52, but asymptomatic no dizziness. She states the pt was not drinking plenty of fluids, but she re-educated the importances of staying hydrated w/ a catheter. She said he did state some back pain, but it sound like muscle pain since the pt states the pain subsided when he laid down.

## 2016-11-05 NOTE — Telephone Encounter (Signed)
Pt's daughter advised as per Dr. Althea CharonKaramalegos.

## 2016-11-05 NOTE — Telephone Encounter (Signed)
Attempted to contact the pt daughter. No answer, vm not set-up.

## 2016-11-05 NOTE — Telephone Encounter (Signed)
Pt daughter, Zannie CoveLisha, called stating she has been seeing blood in pt foley bag. Daughter also stated pt is not having very much fluid in take. Reinforced with daughter the more water pt drinks the less blood will be seen. Per daughter pt denied n/v, f/c, dysuria. Daughter voiced understanding of whole conversation.

## 2016-11-05 NOTE — Telephone Encounter (Signed)
Misty StanleyLisa, pt's daughter said he is complaining of pain in back and thinks he may have UTI.  Please call [phone]

## 2016-11-05 NOTE — Telephone Encounter (Signed)
Acknowledged, reviewed hospital ED record, and discharge. Concern with low BP, however need to improve hydration and continue to monitor, if persistent and symptomatic needs to follow-up sooner.  Regarding concern for UTI. Reviewed ED UA result not entirely consistent with UTI, did not see urine culture.  He does have chronic low back pain as a known problem on medication for this.  He is scheduled to see [facility] 11/26/16.  I would recommend patient be seen more urgently if not improving over next 24-48 hours for possible UTI, prefer patient to follow-up with Urology because he has the indwelling foley catheter, otherwise we could see him here if needed.  Saralyn PilarAlexander Karamalegos, DO [facility] 11/05/2016, 1:14 PM

## 2016-11-06 ENCOUNTER — Telehealth: Payer: Self-pay

## 2016-11-06 NOTE — Telephone Encounter (Signed)
Error

## 2016-11-08 ENCOUNTER — Telehealth: Payer: Self-pay | Admitting: Family Medicine

## 2016-11-08 DIAGNOSIS — I10 Essential (primary) hypertension: Secondary | ICD-10-CM | POA: Diagnosis not present

## 2016-11-08 DIAGNOSIS — R262 Difficulty in walking, not elsewhere classified: Secondary | ICD-10-CM | POA: Diagnosis not present

## 2016-11-08 DIAGNOSIS — G9341 Metabolic encephalopathy: Secondary | ICD-10-CM | POA: Diagnosis not present

## 2016-11-08 DIAGNOSIS — G629 Polyneuropathy, unspecified: Secondary | ICD-10-CM | POA: Diagnosis not present

## 2016-11-08 DIAGNOSIS — J441 Chronic obstructive pulmonary disease with (acute) exacerbation: Secondary | ICD-10-CM | POA: Diagnosis not present

## 2016-11-08 DIAGNOSIS — F419 Anxiety disorder, unspecified: Secondary | ICD-10-CM | POA: Diagnosis not present

## 2016-11-08 NOTE — Telephone Encounter (Signed)
Called pt to schedule for Annual Wellness Visit with Nurse Health Advisor, Tiffany Hill, my c/b # is [phone]  Kathryn Brown ° °

## 2016-11-11 ENCOUNTER — Other Ambulatory Visit: Payer: Self-pay | Admitting: Family Medicine

## 2016-11-11 DIAGNOSIS — J441 Chronic obstructive pulmonary disease with (acute) exacerbation: Secondary | ICD-10-CM | POA: Diagnosis not present

## 2016-11-11 DIAGNOSIS — G9341 Metabolic encephalopathy: Secondary | ICD-10-CM | POA: Diagnosis not present

## 2016-11-11 DIAGNOSIS — G629 Polyneuropathy, unspecified: Secondary | ICD-10-CM | POA: Diagnosis not present

## 2016-11-11 DIAGNOSIS — I10 Essential (primary) hypertension: Secondary | ICD-10-CM | POA: Diagnosis not present

## 2016-11-11 DIAGNOSIS — R262 Difficulty in walking, not elsewhere classified: Secondary | ICD-10-CM | POA: Diagnosis not present

## 2016-11-11 DIAGNOSIS — J449 Chronic obstructive pulmonary disease, unspecified: Secondary | ICD-10-CM

## 2016-11-11 DIAGNOSIS — F419 Anxiety disorder, unspecified: Secondary | ICD-10-CM | POA: Diagnosis not present

## 2016-11-14 ENCOUNTER — Telehealth: Payer: Self-pay | Admitting: Family Medicine

## 2016-11-14 DIAGNOSIS — J410 Simple chronic bronchitis: Secondary | ICD-10-CM

## 2016-11-14 DIAGNOSIS — I10 Essential (primary) hypertension: Secondary | ICD-10-CM | POA: Diagnosis not present

## 2016-11-14 DIAGNOSIS — G629 Polyneuropathy, unspecified: Secondary | ICD-10-CM | POA: Diagnosis not present

## 2016-11-14 DIAGNOSIS — R262 Difficulty in walking, not elsewhere classified: Secondary | ICD-10-CM | POA: Diagnosis not present

## 2016-11-14 DIAGNOSIS — J441 Chronic obstructive pulmonary disease with (acute) exacerbation: Secondary | ICD-10-CM | POA: Diagnosis not present

## 2016-11-14 DIAGNOSIS — F419 Anxiety disorder, unspecified: Secondary | ICD-10-CM | POA: Diagnosis not present

## 2016-11-14 DIAGNOSIS — J40 Bronchitis, not specified as acute or chronic: Secondary | ICD-10-CM

## 2016-11-14 DIAGNOSIS — G9341 Metabolic encephalopathy: Secondary | ICD-10-CM | POA: Diagnosis not present

## 2016-11-14 MED ORDER — AZITHROMYCIN 250 MG PO TABS: ORAL_TABLET | ORAL | 0 refills | Status: AC

## 2016-11-14 NOTE — Telephone Encounter (Addendum)
Last rx azithromycin z-pak 10/03/16, for similar flare of symptoms. As discussed before with patient, this may be episodic flare of COPD and not necessarily a new bronchitis.  I will go ahead and send in another Azithromycin Z-pak for now, however if he continues to require repeat antibiotics, I would recommend that he be seen by his Pulmonologist sooner.  Additional update, [name] this afternoon that patient seems to not be progressing to goals, limited ambulation on his own when not working with therapy. Has BP 110s, with elevated HR at times with activity.  Advised them that he has been on hospice and has palliative care, perhaps if not progressing to goals or not improving or showing sign of decline consider re-evaluation by palliative and determine when meets hospice criteria again.  [name], DO [facility] [place] 11/14/2016, 3:59 PM

## 2016-11-14 NOTE — Telephone Encounter (Signed)
Zannie CoveLisha said pt is coughing up green phlegm and asked if doctor would call in a zpack [phone]

## 2016-11-15 ENCOUNTER — Inpatient Hospital Stay (HOSPITAL_COMMUNITY)
Admission: EM | Admit: 2016-11-15 | Discharge: 2016-12-02 | DRG: 871 | Disposition: E | Payer: Medicare Other | Attending: Internal Medicine | Admitting: Internal Medicine

## 2016-11-15 ENCOUNTER — Encounter (HOSPITAL_COMMUNITY): Payer: Self-pay

## 2016-11-15 ENCOUNTER — Inpatient Hospital Stay (HOSPITAL_COMMUNITY): Payer: Medicare Other

## 2016-11-15 ENCOUNTER — Emergency Department (HOSPITAL_COMMUNITY): Payer: Medicare Other

## 2016-11-15 DIAGNOSIS — R4182 Altered mental status, unspecified: Secondary | ICD-10-CM | POA: Diagnosis present

## 2016-11-15 DIAGNOSIS — Z7952 Long term (current) use of systemic steroids: Secondary | ICD-10-CM | POA: Diagnosis not present

## 2016-11-15 DIAGNOSIS — N3001 Acute cystitis with hematuria: Secondary | ICD-10-CM | POA: Diagnosis not present

## 2016-11-15 DIAGNOSIS — N390 Urinary tract infection, site not specified: Secondary | ICD-10-CM | POA: Diagnosis present

## 2016-11-15 DIAGNOSIS — R509 Fever, unspecified: Secondary | ICD-10-CM | POA: Diagnosis present

## 2016-11-15 DIAGNOSIS — R64 Cachexia: Secondary | ICD-10-CM | POA: Diagnosis present

## 2016-11-15 DIAGNOSIS — J189 Pneumonia, unspecified organism: Secondary | ICD-10-CM

## 2016-11-15 DIAGNOSIS — J309 Allergic rhinitis, unspecified: Secondary | ICD-10-CM | POA: Diagnosis present

## 2016-11-15 DIAGNOSIS — R402421 Glasgow coma scale score 9-12, in the field [EMT or ambulance]: Secondary | ICD-10-CM | POA: Diagnosis not present

## 2016-11-15 DIAGNOSIS — B961 Klebsiella pneumoniae [K. pneumoniae] as the cause of diseases classified elsewhere: Secondary | ICD-10-CM | POA: Diagnosis present

## 2016-11-15 DIAGNOSIS — J441 Chronic obstructive pulmonary disease with (acute) exacerbation: Secondary | ICD-10-CM | POA: Diagnosis present

## 2016-11-15 DIAGNOSIS — Z9981 Dependence on supplemental oxygen: Secondary | ICD-10-CM

## 2016-11-15 DIAGNOSIS — A419 Sepsis, unspecified organism: Secondary | ICD-10-CM | POA: Diagnosis not present

## 2016-11-15 DIAGNOSIS — Z515 Encounter for palliative care: Secondary | ICD-10-CM | POA: Diagnosis present

## 2016-11-15 DIAGNOSIS — I248 Other forms of acute ischemic heart disease: Secondary | ICD-10-CM | POA: Diagnosis present

## 2016-11-15 DIAGNOSIS — E875 Hyperkalemia: Secondary | ICD-10-CM | POA: Diagnosis present

## 2016-11-15 DIAGNOSIS — J440 Chronic obstructive pulmonary disease with acute lower respiratory infection: Secondary | ICD-10-CM | POA: Diagnosis present

## 2016-11-15 DIAGNOSIS — Z881 Allergy status to other antibiotic agents status: Secondary | ICD-10-CM

## 2016-11-15 DIAGNOSIS — R3129 Other microscopic hematuria: Secondary | ICD-10-CM | POA: Diagnosis present

## 2016-11-15 DIAGNOSIS — Z681 Body mass index (BMI) 19 or less, adult: Secondary | ICD-10-CM

## 2016-11-15 DIAGNOSIS — R6521 Severe sepsis with septic shock: Secondary | ICD-10-CM | POA: Diagnosis present

## 2016-11-15 DIAGNOSIS — Z87891 Personal history of nicotine dependence: Secondary | ICD-10-CM

## 2016-11-15 DIAGNOSIS — J181 Lobar pneumonia, unspecified organism: Secondary | ICD-10-CM | POA: Diagnosis not present

## 2016-11-15 DIAGNOSIS — N179 Acute kidney failure, unspecified: Secondary | ICD-10-CM | POA: Diagnosis not present

## 2016-11-15 DIAGNOSIS — Z66 Do not resuscitate: Secondary | ICD-10-CM | POA: Diagnosis present

## 2016-11-15 DIAGNOSIS — Z79899 Other long term (current) drug therapy: Secondary | ICD-10-CM

## 2016-11-15 DIAGNOSIS — Z8249 Family history of ischemic heart disease and other diseases of the circulatory system: Secondary | ICD-10-CM

## 2016-11-15 DIAGNOSIS — R0602 Shortness of breath: Secondary | ICD-10-CM | POA: Diagnosis not present

## 2016-11-15 DIAGNOSIS — J9600 Acute respiratory failure, unspecified whether with hypoxia or hypercapnia: Secondary | ICD-10-CM | POA: Diagnosis present

## 2016-11-15 DIAGNOSIS — F329 Major depressive disorder, single episode, unspecified: Secondary | ICD-10-CM | POA: Diagnosis present

## 2016-11-15 DIAGNOSIS — J9622 Acute and chronic respiratory failure with hypercapnia: Secondary | ICD-10-CM | POA: Diagnosis not present

## 2016-11-15 DIAGNOSIS — J9621 Acute and chronic respiratory failure with hypoxia: Secondary | ICD-10-CM | POA: Diagnosis not present

## 2016-11-15 DIAGNOSIS — F419 Anxiety disorder, unspecified: Secondary | ICD-10-CM | POA: Diagnosis present

## 2016-11-15 LAB — URINALYSIS, ROUTINE W REFLEX MICROSCOPIC
Bilirubin Urine: NEGATIVE
GLUCOSE, UA: NEGATIVE mg/dL
Ketones, ur: NEGATIVE mg/dL
NITRITE: NEGATIVE
PROTEIN: 100 mg/dL — AB
Specific Gravity, Urine: 1.016 (ref 1.005–1.030)
pH: 5 (ref 5.0–8.0)

## 2016-11-15 LAB — CBC WITH DIFFERENTIAL/PLATELET
BAND NEUTROPHILS: 0 %
BLASTS: 0 %
Basophils Absolute: 0 10*3/uL (ref 0.0–0.1)
Basophils Relative: 0 %
EOS ABS: 0 10*3/uL (ref 0.0–0.7)
Eosinophils Relative: 0 %
HEMATOCRIT: 47.5 % (ref 39.0–52.0)
HEMOGLOBIN: 15.6 g/dL (ref 13.0–17.0)
Lymphocytes Relative: 0 %
Lymphs Abs: 0 10*3/uL — ABNORMAL LOW (ref 0.7–4.0)
MCH: 28.8 pg (ref 26.0–34.0)
MCHC: 32.8 g/dL (ref 30.0–36.0)
MCV: 87.6 fL (ref 78.0–100.0)
METAMYELOCYTES PCT: 0 %
MONO ABS: 1 10*3/uL (ref 0.1–1.0)
Monocytes Relative: 4 %
Myelocytes: 0 %
Neutro Abs: 22.8 10*3/uL — ABNORMAL HIGH (ref 1.7–7.7)
Neutrophils Relative %: 96 %
Other: 0 %
PROMYELOCYTES ABS: 0 %
Platelets: 305 10*3/uL (ref 150–400)
RBC: 5.42 MIL/uL (ref 4.22–5.81)
RDW: 14.1 % (ref 11.5–15.5)
WBC Morphology: INCREASED
WBC: 23.8 10*3/uL — ABNORMAL HIGH (ref 4.0–10.5)
nRBC: 0 /100 WBC

## 2016-11-15 LAB — COMPREHENSIVE METABOLIC PANEL
ALBUMIN: 2.3 g/dL — AB (ref 3.5–5.0)
ALK PHOS: 81 U/L (ref 38–126)
ALT: 10 U/L — AB (ref 17–63)
ANION GAP: 16 — AB (ref 5–15)
AST: 30 U/L (ref 15–41)
BILIRUBIN TOTAL: 1.8 mg/dL — AB (ref 0.3–1.2)
BUN: 43 mg/dL — ABNORMAL HIGH (ref 6–20)
CALCIUM: 9.4 mg/dL (ref 8.9–10.3)
CO2: 25 mmol/L (ref 22–32)
CREATININE: 1.89 mg/dL — AB (ref 0.61–1.24)
Chloride: 91 mmol/L — ABNORMAL LOW (ref 101–111)
GFR calc Af Amer: 39 mL/min — ABNORMAL LOW (ref >60)
GFR calc non Af Amer: 33 mL/min — ABNORMAL LOW (ref >60)
GLUCOSE: 128 mg/dL — AB (ref 65–99)
Potassium: 5.7 mmol/L — ABNORMAL HIGH (ref 3.5–5.1)
Sodium: 132 mmol/L — ABNORMAL LOW (ref 135–145)
TOTAL PROTEIN: 6.3 g/dL — AB (ref 6.5–8.1)

## 2016-11-15 LAB — I-STAT ARTERIAL BLOOD GAS, ED
BICARBONATE: 27.3 mmol/L (ref 20.0–28.0)
O2 SAT: 98 %
PCO2 ART: 54.1 mmHg — AB (ref 32.0–48.0)
Patient temperature: 98.6
TCO2: 29 mmol/L (ref 22–32)
pH, Arterial: 7.311 — ABNORMAL LOW (ref 7.350–7.450)
pO2, Arterial: 111 mmHg — ABNORMAL HIGH (ref 83.0–108.0)

## 2016-11-15 LAB — I-STAT CG4 LACTIC ACID, ED
Lactic Acid, Venous: 4.77 mmol/L (ref 0.5–1.9)
Lactic Acid, Venous: 2.37 mmol/L (ref 0.5–1.9)

## 2016-11-15 LAB — CREATININE, SERUM
Creatinine, Ser: 1.64 mg/dL — ABNORMAL HIGH (ref 0.61–1.24)
GFR, EST AFRICAN AMERICAN: 46 mL/min — AB (ref >60)
GFR, EST NON AFRICAN AMERICAN: 40 mL/min — AB (ref >60)

## 2016-11-15 LAB — TROPONIN I
Troponin I: 0.18 ng/mL (ref <0.03)
Troponin I: 0.07 ng/mL (ref <0.03)

## 2016-11-15 LAB — D-DIMER, QUANTITATIVE: D-Dimer, Quant: 3.15 ug/mL-FEU — ABNORMAL HIGH (ref 0.00–0.50)

## 2016-11-15 LAB — TSH: TSH: 1.061 u[IU]/mL (ref 0.350–4.500)

## 2016-11-15 MED ORDER — IPRATROPIUM BROMIDE 0.06 % NA SOLN: 2.0000 | Freq: Four times a day (QID) | NASAL | Status: DC

## 2016-11-15 MED ORDER — METHYLPREDNISOLONE SODIUM SUCC 40 MG IJ SOLR
40.0000 mg | Freq: Four times a day (QID) | INTRAMUSCULAR | Status: DC
  Administered 2016-11-15: 40 mg via INTRAVENOUS
  Filled 2016-11-15: qty 1

## 2016-11-15 MED ORDER — ALBUTEROL SULFATE (2.5 MG/3ML) 0.083% IN NEBU: 2.5000 mg | INHALATION_SOLUTION | Freq: Four times a day (QID) | RESPIRATORY_TRACT | Status: DC | PRN

## 2016-11-15 MED ORDER — DEXTROSE 5 % IV SOLN
1.0000 g | INTRAVENOUS | Status: DC
  Filled 2016-11-15: qty 10

## 2016-11-15 MED ORDER — SODIUM CHLORIDE 0.9 % IV BOLUS (SEPSIS)
1000.0000 mL | Freq: Once | INTRAVENOUS | Status: AC
  Administered 2016-11-15: 1000 mL via INTRAVENOUS

## 2016-11-15 MED ORDER — GABAPENTIN 100 MG PO CAPS: 100.0000 mg | ORAL_CAPSULE | Freq: Three times a day (TID) | ORAL | Status: DC

## 2016-11-15 MED ORDER — LORAZEPAM 0.5 MG PO TABS: 0.5000 mg | ORAL_TABLET | Freq: Three times a day (TID) | ORAL | Status: DC | PRN

## 2016-11-15 MED ORDER — CALCIUM GLUCONATE 10 % IV SOLN
1.0000 g | Freq: Once | INTRAVENOUS | Status: AC
  Administered 2016-11-15: 1 g via INTRAVENOUS
  Filled 2016-11-15: qty 10

## 2016-11-15 MED ORDER — TAMSULOSIN HCL 0.4 MG PO CAPS: 0.8000 mg | ORAL_CAPSULE | Freq: Every day | ORAL | Status: DC

## 2016-11-15 MED ORDER — BACLOFEN 5 MG HALF TABLET
5.0000 mg | ORAL_TABLET | Freq: Three times a day (TID) | ORAL | Status: DC | PRN
Start: 2016-11-15 — End: 2016-11-16
  Filled 2016-11-15: qty 2

## 2016-11-15 MED ORDER — FLUTICASONE PROPIONATE 50 MCG/ACT NA SUSP: 2.0000 | Freq: Every day | NASAL | Status: DC | PRN

## 2016-11-15 MED ORDER — VANCOMYCIN HCL IN DEXTROSE 1-5 GM/200ML-% IV SOLN
1000.0000 mg | Freq: Once | INTRAVENOUS | Status: AC
  Administered 2016-11-16: 1000 mg via INTRAVENOUS
  Filled 2016-11-15: qty 200

## 2016-11-15 MED ORDER — ACETAMINOPHEN 325 MG PO TABS: 650.0000 mg | ORAL_TABLET | Freq: Four times a day (QID) | ORAL | Status: DC | PRN

## 2016-11-15 MED ORDER — VANCOMYCIN HCL 500 MG IV SOLR: 500.0000 mg | INTRAVENOUS | Status: DC

## 2016-11-15 MED ORDER — AZITHROMYCIN 500 MG IV SOLR
500.0000 mg | Freq: Once | INTRAVENOUS | Status: AC
  Administered 2016-11-15: 500 mg via INTRAVENOUS
  Filled 2016-11-15: qty 500

## 2016-11-15 MED ORDER — DEXTROSE 5 % IV SOLN
500.0000 mg | INTRAVENOUS | Status: DC
  Filled 2016-11-15: qty 500

## 2016-11-15 MED ORDER — ATORVASTATIN CALCIUM 40 MG PO TABS: 40.0000 mg | ORAL_TABLET | Freq: Every day | ORAL | Status: DC

## 2016-11-15 MED ORDER — ACETAMINOPHEN 650 MG RE SUPP
650.0000 mg | Freq: Once | RECTAL | Status: AC
  Administered 2016-11-15: 650 mg via RECTAL
  Filled 2016-11-15: qty 1

## 2016-11-15 MED ORDER — METHYLPREDNISOLONE SODIUM SUCC 125 MG IJ SOLR
80.0000 mg | Freq: Three times a day (TID) | INTRAMUSCULAR | Status: DC
  Administered 2016-11-16: 80 mg via INTRAVENOUS
  Filled 2016-11-15: qty 2

## 2016-11-15 MED ORDER — SODIUM CHLORIDE 0.9 % IV BOLUS (SEPSIS)
250.0000 mL | Freq: Once | INTRAVENOUS | Status: AC
  Administered 2016-11-15: 250 mL via INTRAVENOUS

## 2016-11-15 MED ORDER — SODIUM CHLORIDE 0.9 % IV BOLUS (SEPSIS)
500.0000 mL | Freq: Once | INTRAVENOUS | Status: AC
  Administered 2016-11-15: 500 mL via INTRAVENOUS

## 2016-11-15 MED ORDER — ACETAMINOPHEN 650 MG RE SUPP: 650.0000 mg | Freq: Four times a day (QID) | RECTAL | Status: DC | PRN

## 2016-11-15 MED ORDER — FINASTERIDE 5 MG PO TABS: 5.0000 mg | ORAL_TABLET | Freq: Every day | ORAL | Status: DC

## 2016-11-15 MED ORDER — CEFTRIAXONE SODIUM 1 G IJ SOLR
1.0000 g | Freq: Once | INTRAMUSCULAR | Status: AC
  Administered 2016-11-15: 1 g via INTRAVENOUS
  Filled 2016-11-15: qty 10

## 2016-11-15 MED ORDER — SODIUM CHLORIDE 0.9 % IV SOLN
INTRAVENOUS | Status: DC
  Administered 2016-11-15: 23:00:00 via INTRAVENOUS

## 2016-11-15 MED ORDER — SODIUM POLYSTYRENE SULFONATE 15 GM/60ML PO SUSP
30.0000 g | Freq: Once | ORAL | Status: DC
  Filled 2016-11-15: qty 120

## 2016-11-15 MED ORDER — HEPARIN SODIUM (PORCINE) 5000 UNIT/ML IJ SOLN: 5000.0000 [IU] | Freq: Three times a day (TID) | INTRAMUSCULAR | Status: DC

## 2016-11-15 MED ORDER — IPRATROPIUM-ALBUTEROL 0.5-2.5 (3) MG/3ML IN SOLN
3.0000 mL | Freq: Four times a day (QID) | RESPIRATORY_TRACT | Status: DC
  Administered 2016-11-15 – 2016-11-16 (×2): 3 mL via RESPIRATORY_TRACT
  Filled 2016-11-15 (×2): qty 3

## 2016-11-15 MED ORDER — SODIUM BICARBONATE 8.4 % IV SOLN
50.0000 meq | Freq: Once | INTRAVENOUS | Status: AC
  Administered 2016-11-15: 50 meq via INTRAVENOUS
  Filled 2016-11-15: qty 50

## 2016-11-15 NOTE — ED Notes (Addendum)
Pt now alert and oriented x 3. Pt spitting thick Tyashia Morrisette mucous into emesis bag. Pt following commands. Pt tolerating [place] well with spo2 in mid 90s.

## 2016-11-15 NOTE — Progress Notes (Signed)
Removed BiPAP from patient due to coughing up brownish secretions in mask. Patient respiratory rate is much lower and is mentation is better. Placed on NRB at this time. RN at bedside.

## 2016-11-15 NOTE — ED Notes (Signed)
Code Sepsis activated @ 1403 (RN aware)

## 2016-11-15 NOTE — ED Notes (Signed)
Respiratory came and took pt off bipap and replaced it with a non rebreather at 15L of O2.

## 2016-11-15 NOTE — ED Notes (Signed)
Date and time results received: 11/29/2016  4:09 PM  Test: Troponin  Critical Value: 0.18  Name of Provider Notified: Dr. Particia Nearing  Orders Received? Or Actions Taken?:  No new orders.

## 2016-11-15 NOTE — ED Triage Notes (Signed)
Per EMS, pt from home with complaint of altered mental status and resp distress. Pt's family was not helpful in giving hx of pt, just stated that he normally is able to get up and walk around independently. They stated that pt has been on antibiotics and prednisone x 2 days but the do not know why. Spo2 80 on RA, 95% on NRB 15L, BP 98/72, HR 140 sinus tach, CBG 146, RR 50. Pt has DNR.

## 2016-11-15 NOTE — ED Notes (Signed)
RT in room to start Bipap

## 2016-11-15 NOTE — Progress Notes (Signed)
Pharmacy Antibiotic Note  Connor Lewis is a 76 y.o. male admitted on 11/14/2016 with sepsis secondary to PNA vs UTI.  Pharmacy has been consulted for Vancomycin dosing.  Pt presented to ED with severe COPD presented to ED with AMS, cough, purulent sputum, and fever 104.2.  On O2 at home, placed on BiPAP in ED.  CXR shows extensive RLL PNA.  Also noted AKI, with SCr 1.89 (was 0.9 in Aug 2018).  Plan: Vanc 1gm IV x 1, then  IV q24h Goal Vancomycin trough 15-20 mcg/ml Follow up MRSA PCR and d/c Vanc? Follow up clinical course, renal function Vancomycin trough at steady state  Height:  (175.3 cm) Weight: 115 lb (52.2 kg) IBW/kg (Calculated) : 70.7  Temp (24hrs), Avg:100.7 F (38.2 C), Min:97.7 F (36.5 C), Max:104.2 F (40.1 C)   Recent Labs Lab 11/07/2016 1423 11/18/2016 1433 11/11/2016 1743  WBC 23.8*  --   --   CREATININE 1.89*  --   --   LATICACIDVEN  --  4.77* 2.37*    Estimated Creatinine Clearance: 25.3 mL/min (A) (by C-G formula based on SCr of 1.89 mg/dL (H)).    Allergies  Allergen Reactions  . Doxycycline Anaphylaxis and Rash    Given Epi-pen at [facility]  . Sulfa Antibiotics Other (See Comments)    pain    Antimicrobials this admission: Rocephin 9/14 >> Azithro 9/14 >> Vanc 9/14 >>  Dose adjustments this admission:   Microbiology results: 9/14 BCx: ordered  Thank you for allowing pharmacy to be a part of this patient's care.  [employer], Pharm.D., BCPS Clinical Pharmacist 11/03/2016 10:25 PM

## 2016-11-15 NOTE — Consult Note (Signed)
PULMONARY / CRITICAL CARE MEDICINE   Name: Connor Lewis MRN: [id number] DOB: [birth year]    ADMISSION DATE:  11/27/2016 CONSULTATION DATE:  11/09/2016  REFERRING MD: Para Skeans MD  CHIEF COMPLAINT:  Sepsis, pneumonia  HISTORY OF PRESENT ILLNESS:   76 year old with severe COPD, previously on home hospice per pulmonary clinic notes (follows at [facility]). He still maintained on home oxygen and chronic prednisone at 15 mg at home. Presents with altered mental status, cough, purulent sputum. He was given Zithromax as an outpatient for his PCP 1 day ago  Noted to have elevated lactic acid and hypercarbic respiratory failure on ABG. He has been placed on BiPAP with improvement in mental and respiratory distress. PCCM called for evaluation.  PAST MEDICAL HISTORY :  He  has a past medical history of Anxiety and depression; Chronic bronchiolitis (HCC); COPD (chronic obstructive pulmonary disease) (HCC); Diverticulitis; Urinary retention; and Urine incontinence.  PAST SURGICAL HISTORY: He  has a past surgical history that includes none.  Allergies  Allergen Reactions  . Doxycycline Anaphylaxis and Rash    Given Epi-pen at [facility]  . Sulfa Antibiotics Other (See Comments)    pain    No current facility-administered medications on file prior to encounter.    Current Outpatient Prescriptions on File Prior to Encounter  Medication Sig  . acetaminophen (TYLENOL) 325 MG tablet Take 325 mg by mouth every 6 (six) hours as needed for mild pain or moderate pain.  Marland Kitchen albuterol (PROVENTIL HFA;VENTOLIN HFA) 108 (90 Base) MCG/ACT inhaler Inhale 2 puffs into the lungs every 6 (six) hours as needed for wheezing or shortness of breath.  Marland Kitchen atorvastatin (LIPITOR) 40 MG tablet Take 1 tablet (40 mg total) by mouth daily.  Marland Kitchen azithromycin (ZITHROMAX Z-PAK) 250 MG tablet Take 2 tabs (  total) on Day 1. Take 1 tab ( ) daily for next 4 days.  . baclofen (LIORESAL) 10 MG tablet Take 0.5-1 tablets (5-10 mg  total) by mouth 3 (three) times daily as needed for muscle spasms.  . carbamide peroxide (EAR WAX REMOVAL DROPS) 6.5 % otic solution Place 5 drops into both ears 2 (two) times daily. (Patient taking differently: Place 5 drops into both ears 2 (two) times daily as needed (ear wax). )  . finasteride (PROSCAR) 5 MG tablet TAKE ONE (1) TABLET EACH DAY  . fluticasone (FLONASE) 50 MCG/ACT nasal spray Place 2 sprays into both nostrils daily. (Patient taking differently: Place 2 sprays into both nostrils daily as needed for allergies or rhinitis. )  . Fluticasone-Umeclidin-Vilant (TRELEGY ELLIPTA) 100-62.5-25 MCG/INH AEPB Inhale 1 puff into the lungs daily.  Marland Kitchen gabapentin (NEURONTIN) 100 MG capsule TAKE ONE (1) CAPSULE THREE (3) TIMES EACH DAY AS NEEDED FOR NERVE PAIN  . HYDROcodone-acetaminophen (NORCO/VICODIN) 5-325 MG tablet Take 0.5 tablets by mouth every 6 (six) hours as needed for moderate pain.  Marland Kitchen ipratropium (ATROVENT) 0.06 % nasal spray Place 2 sprays into both nostrils 4 (four) times daily. For up to 5-7 days then stop.  Marland Kitchen Ipratropium-Albuterol (COMBIVENT) 20-100 MCG/ACT AERS respimat Inhale 1 puff into the lungs every 6 (six) hours as needed for wheezing or shortness of breath.  . lisinopril (PRINIVIL,ZESTRIL) 5 MG tablet Take 1 tablet (5 mg total) by mouth daily.  Marland Kitchen LORazepam (ATIVAN) 1 MG tablet Take 0.5-1 tablets (0.5-1 mg total) by mouth every 8 (eight) hours as needed for anxiety or sleep. (Patient taking differently: Take 0.5 mg by mouth every 8 (eight) hours as needed for anxiety or sleep. )  .  Meth-Hyo-M Bl-Na Phos-Ph Sal (URIBEL) 118 MG CAPS Take 1 capsule (118 mg total) by mouth QID. (Patient not taking: Reported on 10/11/2016)  . Nutritional Supplements (SCANDISHAKE) MISC Take 1 each by mouth daily.  . OXYGEN Inhale 3-5 L into the lungs continuous.   . predniSONE (DELTASONE) 20 MG tablet Take 1 tablet (20 mg total) by mouth daily.  Marland Kitchen SILTUSSIN SA 100 MG/5ML syrup TAKE ONE TO TWO  TEASPOONFULS EVERY FOUR HOURS AS NEEDED FOR COUGH EXPECTORANT  . tamsulosin (FLOMAX) 0.4 MG CAPS capsule Take 2 capsules (0.8 mg total) by mouth at bedtime.    FAMILY HISTORY:  His indicated that his mother is deceased. He indicated that his father is deceased. He indicated that the status of his neg hx is unknown. He indicated that the status of his unknown relative is unknown.    SOCIAL HISTORY: He  reports that he has quit smoking. He has a 56.00 pack-year smoking history. He has quit using smokeless tobacco. He reports that he drinks alcohol. He reports that he does not use drugs.  REVIEW OF SYSTEMS:   Unable to obtain.  SUBJECTIVE:    VITAL SIGNS: BP 129/82   Pulse (!) 104   Temp 100.2 F (37.9 C) (Rectal)   Resp (!) 21   Ht  (1.753 m)   Wt 113 lb (51.3 kg)   SpO2 94%   BMI 16.69 kg/m   HEMODYNAMICS:    VENTILATOR SETTINGS: Vent Mode: BIPAP FiO2 (%):  [45 %-100 %] 45 % Set Rate:  [15 bmp] 15 bmp PEEP:  [5 cmH20] 5 cmH20  INTAKE / OUTPUT: No intake/output data recorded.  PHYSICAL EXAMINATION: Blood pressure (!) 132/98, pulse (!) 111, temperature 100.2 F (37.9 C), temperature source Rectal, resp. rate (!) 22, height  (1.753 m), weight 113 lb (51.3 kg), SpO2 93 %. Gen:      No acute distress HEENT:  EOMI, sclera anicteric Neck:     No masses; no thyromegaly, bipap Lungs:    Scattered crackles; normal respiratory effort CV:         Regular rate and rhythm; no murmurs Abd:      + bowel sounds; soft, non-tender; no palpable masses, no distension Ext:    No edema; adequate peripheral perfusion Skin:      Warm and dry; no rash Neuro: alert and oriented x 3 Psych: normal mood and affect  LABS:  BMET  Recent Labs Lab 11/22/2016 1423  NA 132*  K 5.7*  CL 91*  CO2 25  BUN 43*  CREATININE 1.89*  GLUCOSE 128*    Electrolytes  Recent Labs Lab 11/17/2016 1423  CALCIUM 9.4    CBC  Recent Labs Lab 11/20/2016 1423  WBC 23.8*  HGB 15.6  HCT  47.5  PLT 305    Coag's No results for input(s): APTT, INR in the last 168 hours.  Sepsis Markers  Recent Labs Lab 11/14/2016 1433 11/17/2016 1743  LATICACIDVEN 4.77* 2.37*    ABG  Recent Labs Lab 11/27/2016 1451  PHART 7.311*  PCO2ART 54.1*  PO2ART 111.0*    Liver Enzymes  Recent Labs Lab 11/29/2016 1423  AST 30  ALT 10*  ALKPHOS 81  BILITOT 1.8*  ALBUMIN 2.3*    Cardiac Enzymes  Recent Labs Lab 11/21/2016 1423  TROPONINI 0.18*    Glucose No results for input(s): GLUCAP in the last 168 hours.  Imaging Dg Chest Port 1 View  Result Date: 11/29/2016 CLINICAL DATA:  Short of breath.  COPD.  Code sepsis. EXAM: PORTABLE CHEST 1 VIEW COMPARISON:  10/11/2016 FINDINGS: Extensive airspace disease in the right lower lobe compatible with pneumonia. Extreme lung base not included on the study. Left lung is clear COPD with hyperinflation.  Apical scarring on the right IMPRESSION: COPD with extensive right lower lobe pneumonia Electronically Signed   By: Marlan Palau M.D.   On: 11/28/2016 14:22   I reviewed images personally  STUDIES:    CULTURES: Bcx 9/14>  ANTIBIOTICS: Ceftriaxone 9/14> Azithro 9/14>  SIGNIFICANT EVENTS:   LINES/TUBES:  DISCUSSION: 76 year old with severe COPD, DNR status. Admitted with sepsis secondary to multilobar pneumonia and COPD exacerbation.  Pt has improved on Bipap and is OK to go to SDU  AECOPD Hypercarbic hypoxic resp failure Pneumonia Continue bipap as needed. Ok to give short breaks. Standing atrovent nebs Start IV solumedrol  Sepsis Elevated LA > improving Elevated troponin ? demand Continue antibiotics for CAP coverage Follow LA, troponin  AKI Secondary to sepsis Follow urine output and cr  Code status DNR. Confirmed with the patient.  Chilton Greathouse MD [facility] Pager [phone] If no answer or after 3pm call: [phone] 11/15/2016, 6:57 PM

## 2016-11-15 NOTE — ED Notes (Signed)
Critical care at bedside stated that pt could have ice chips.

## 2016-11-15 NOTE — ED Notes (Signed)
ED Provider at bedside. 

## 2016-11-15 NOTE — H&P (Addendum)
TRH H&P   Patient Demographics:    Connor Lewis, is a 76 y.o. male  MRN: [id number]   DOB - 08/06/1940  Admit Date - 11/15/2016  Outpatient Primary MD for the patient is Althea Charon Netta Neat, DO  Referring MD/NP/PA:  Jacalyn Lefevre  Outpatient Specialists:   Patient coming from: home  Chief Complaint  Patient presents with  . Code Sepsis      HPI:    Connor Lewis  is a 76 y.o. 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    In ED,  Pt noted to have lactic acid =4.77, hypercarbic respiratory failure, CXR => Copd with extensive right lower lobe pneumonia.  ua =>wbc 6-30 rbc tntc.  Bun/creat  43/1.89.  Alb 2.3  EKG st at 134, nl axis, no st-t changes c/w ischemia.  Trop 0.18    PCCM consulted and thought could be admitted to stepdown.     Review of systems:    In addition to the HPI above, No Fever-chills, No Headache, No changes with Vision or hearing, No problems swallowing food or Liquids, No Chest pain   No Abdominal pain, No Nausea or Vommitting, Bowel movements are regular, No Blood in stool or Urine, No dysuria, No new skin rashes or bruises, No new joints pains-aches,  No new weakness, tingling, numbness in any extremity, No recent weight gain or loss, No polyuria, polydypsia or polyphagia, No significant Mental Stressors.  A full 10 point Review of Systems was done, except as stated above, all other Review of Systems were negative.   With Past History of the following :    Past Medical History:  Diagnosis Date  . Anxiety and depression   . Chronic bronchiolitis (HCC)   . 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 Diverticulitis   . Urinary retention   .  Urine incontinence       Past Surgical History:  Procedure Laterality Date  . none        Social History:     Social History  Substance Use Topics  . Smoking status: Former Smoker    Packs/day: 1.00    Years: 56.00  . Smokeless tobacco: Former Neurosurgeon     Comment: quit in 2015  . Alcohol use 0.0 oz/week     Comment: occ     Lives - at home  Mobility - unclear   Family History :     Family History  Problem Relation Age of Onset  . Stroke Mother   . Diabetes Mother   . Heart disease Father   . Kidney disease Unknown        mothers side  . Prostate cancer Neg Hx   .  Kidney cancer Neg Hx   . Bladder Cancer Neg Hx       Home Medications:   Prior to Admission medications   Medication Sig Start Date End Date Taking? Authorizing Provider  acetaminophen (TYLENOL) 325 MG tablet Take 325 mg by mouth every 6 (six) hours as needed for mild pain or moderate pain.    [provider]  albuterol (PROVENTIL HFA;VENTOLIN HFA) 108 (90 Base) MCG/ACT inhaler Inhale 2 puffs into the lungs every 6 (six) hours as needed for wheezing or shortness of breath. 07/06/15   Krebs, Laurel Dimmer, NP  atorvastatin (LIPITOR) 40 MG tablet Take 1 tablet (40 mg total) by mouth daily. 02/29/16   Karamalegos, Netta Neat, DO  azithromycin (ZITHROMAX Z-PAK) 250 MG tablet Take 2 tabs (  total) on Day 1. Take 1 tab ( ) daily for next 4 days. 11/14/16   Karamalegos, Netta Neat, DO  baclofen (LIORESAL) 10 MG tablet Take 0.5-1 tablets (5-10 mg total) by mouth 3 (three) times daily as needed for muscle spasms. 05/07/16   Karamalegos, Netta Neat, DO  carbamide peroxide (EAR WAX REMOVAL DROPS) 6.5 % otic solution Place 5 drops into both ears 2 (two) times daily. Patient taking differently: Place 5 drops into both ears 2 (two) times daily as needed (ear wax).  05/11/15   Loura Pardon, NP  finasteride (PROSCAR) 5 MG tablet TAKE ONE (1) TABLET EACH DAY 08/13/16   Vanna Scotland, MD  fluticasone  [facility]) 50 MCG/ACT nasal spray Place 2 sprays into both nostrils daily. Patient taking differently: Place 2 sprays into both nostrils daily as needed for allergies or rhinitis.  03/25/16   Karamalegos, Netta Neat, DO  Fluticasone-Umeclidin-Vilant (TRELEGY ELLIPTA) 100-62.5-25 MCG/INH AEPB Inhale 1 puff into the lungs daily. 05/20/16   Merwyn Katos, MD  gabapentin (NEURONTIN) 100 MG capsule TAKE ONE (1) CAPSULE THREE (3) TIMES EACH DAY AS NEEDED FOR NERVE PAIN 08/06/16   Smitty Cords, DO  HYDROcodone-acetaminophen (NORCO/VICODIN) 5-325 MG tablet Take 0.5 tablets by mouth every 6 (six) hours as needed for moderate pain.    [provider]  ipratropium (ATROVENT) 0.06 % nasal spray Place 2 sprays into both nostrils 4 (four) times daily. For up to 5-7 days then stop. 09/13/16   Karamalegos, Netta Neat, DO  Ipratropium-Albuterol (COMBIVENT) 20-100 MCG/ACT AERS respimat Inhale 1 puff into the lungs every 6 (six) hours as needed for wheezing or shortness of breath. 03/25/16   Karamalegos, Netta Neat, DO  lisinopril (PRINIVIL,ZESTRIL) 5 MG tablet Take 1 tablet (5 mg total) by mouth daily. 03/25/16   Karamalegos, Netta Neat, DO  LORazepam (ATIVAN) 1 MG tablet Take 0.5-1 tablets (0.5-1 mg total) by mouth every 8 (eight) hours as needed for anxiety or sleep. Patient taking differently: Take 0.5 mg by mouth every 8 (eight) hours as needed for anxiety or sleep.  09/13/16   Karamalegos, Netta Neat, DO  Meth-Hyo-M Bl-Na Phos-Ph Sal (URIBEL) 118 MG CAPS Take 1 capsule (118 mg total) by mouth QID. Patient not taking: Reported on 10/11/2016 08/30/15   Michiel Cowboy A, PA-C  Nutritional Supplements Sheila Oats) MISC Take 1 each by mouth daily. 08/10/15   Loura Pardon, NP  OXYGEN Inhale 3-5 L into the lungs continuous.     [provider]  predniSONE (DELTASONE) 20 MG tablet Take 1 tablet (20 mg total) by mouth daily. 10/16/16   Merwyn Katos, MD  SILTUSSIN SA 100 MG/5ML syrup TAKE  ONE TO TWO TEASPOONFULS EVERY FOUR HOURS AS NEEDED FOR  COUGH EXPECTORANT 09/09/16   Karamalegos, Netta Neat, DO  tamsulosin (FLOMAX) 0.4 MG CAPS capsule Take 2 capsules (0.8 mg total) by mouth at bedtime. 03/25/16   Smitty Cords, DO     Allergies:     Allergies  Allergen Reactions  . Doxycycline Anaphylaxis and Rash    Given Epi[facility]pen at [facility]  . Sulfa Antibiotics Other (See Comments)    pain     Physical Exam:   Vitals  Blood pressure 111/81, pulse (!) 117, temperature 97.7 F (36.5 C), temperature source Axillary, resp. rate 20, height  (1.753 m), weight 52.2 kg (115 lb), SpO2 94 %.   1. General  lying in bed in NAD,   2. Normal affect and insight, Not Suicidal or Homicidal, Awake Alert, Oriented X 3.  3. No F.N deficits, ALL C.Nerves Intact, Strength 5/5 all 4 extremities, Sensation intact all 4 extremities, Plantars down going.  4. Ears and Eyes appear Normal, Conjunctivae clear, PERRLA. Moist Oral Mucosa.  5. Supple Neck, No JVD, No cervical lymphadenopathy appriciated, No Carotid Bruits.  6.  + bilateral exp wheezing,  Crackles right lower lung   7. Tachy s1, s2, no m/g/r   8. Positive Bowel Sounds, Abdomen Soft, No tenderness, No organomegaly appriciated,No rebound -guarding or rigidity.  9.  No Cyanosis, Normal Skin Turgor, No Skin Rash or Bruise.  10. Good muscle tone,  joints appear normal , no effusions, Normal ROM.  11. No Palpable Lymph Nodes in Neck or Axillae  Generally cachectic   Data Review:    CBC  Recent Labs Lab 13-Dec-2016 1423  WBC 23.8*  HGB 15.6  HCT 47.5  PLT 305  MCV 87.6  MCH 28.8  MCHC 32.8  RDW 14.1  LYMPHSABS 0.0*  MONOABS 1.0  EOSABS 0.0  BASOSABS 0.0   ------------------------------------------------------------------------------------------------------------------  Chemistries   Recent Labs Lab 12/13/16 1423  NA 132*  K 5.7*  CL 91*  CO2 25  GLUCOSE 128*  BUN 43*  CREATININE 1.89*  CALCIUM 9.4    AST 30  ALT 10*  ALKPHOS 81  BILITOT 1.8*   ------------------------------------------------------------------------------------------------------------------ estimated creatinine clearance is 25.3 mL/min (A) (by C-G formula based on SCr of 1.89 mg/dL (H)). ------------------------------------------------------------------------------------------------------------------ No results for input(s): TSH, T4TOTAL, T3FREE, THYROIDAB in the last 72 hours.  Invalid input(s): FREET3  Coagulation profile No results for input(s): INR, PROTIME in the last 168 hours. ------------------------------------------------------------------------------------------------------------------- No results for input(s): DDIMER in the last 72 hours. -------------------------------------------------------------------------------------------------------------------  Cardiac Enzymes  Recent Labs Lab December 13, 2016 1423  TROPONINI 0.18*   ------------------------------------------------------------------------------------------------------------------    Component Value Date/Time   BNP 57.0 08/03/2015 2319     ---------------------------------------------------------------------------------------------------------------  Urinalysis    Component Value Date/Time   COLORURINE YELLOW 12/13/2016 1615   APPEARANCEUR HAZY (A) 12-13-2016 1615   APPEARANCEUR Clear 08/21/2015 1030   LABSPEC 1.016 2016-12-13 1615   PHURINE 5.0 2016-12-13 1615   GLUCOSEU NEGATIVE 12-13-16 1615   HGBUR MODERATE (A) 2016-12-13 1615   BILIRUBINUR NEGATIVE 13-Dec-2016 1615   BILIRUBINUR Negative 08/21/2015 1030   KETONESUR NEGATIVE Dec 13, 2016 1615   PROTEINUR 100 (A) 12/13/16 1615   UROBILINOGEN negative 06/22/2015 1345   NITRITE NEGATIVE 2016-12-13 1615   LEUKOCYTESUR SMALL (A) 12/13/2016 1615   LEUKOCYTESUR Negative 08/21/2015 1030     ----------------------------------------------------------------------------------------------------------------   Imaging Results:    Dg Chest Port 1 View  Result Date: 12-13-2016 CLINICAL DATA:  Short of breath.  COPD.  Code sepsis. EXAM: PORTABLE CHEST 1 VIEW COMPARISON:  10/11/2016 FINDINGS: Extensive  airspace disease in the right lower lobe compatible with pneumonia. Extreme lung base not included on the study. Left lung is clear COPD with hyperinflation.  Apical scarring on the right IMPRESSION: COPD with extensive right lower lobe pneumonia Electronically Signed   By: Marlan Palau M.D.   On: 11/04/2016 14:22      Assessment & Plan:    Principal Problem:   Acute on chronic respiratory failure with hypoxia and hypercapnia (HCC) Active Problems:   Sepsis (HCC)   Hyperkalemia   Fever   ARF (acute renal failure) (HCC)   UTI (urinary tract infection)    RLL pneumonia Rocephin, zithromax, vanco iv pharmacy to dose  Sepsis secondary to pneumonia and uti Blood culture x2 pending Urine culture pending  Fever secondary to pneumonia, /uti  Acute hypercarbic/hypoxic resp failure Cont Bipap Solumedrol iv duoneb 1 neb q6h and albuterol neb q6h prn Cont iv abx Appreciate PCCM consult  ARF Check urine sodium, urine creatinine, urine eosinophils, check renal ultrasound Hydrate with ns iv Check cmp in am  Hyperkalemia Sodium bicarb iv Calcium gluconate iv Kayexalate 30 gm po x1 Check cmp in am  Tachycardia Trop I q6h x3 Check tsh Check d dimer If d dimer positive then obtain VQ Check echo  Microscopic hematuria tx uti,  Consider CT scan  Abd/pelvis when more stable  Trop elevation likely demand ischemia Tele Trop iq 6h x3 Check echo Cont aspirin, cont lipitor Cardiology consulted by email    DVT Prophylaxis Heparin - - SCDs   AM Labs Ordered, also please review Full Orders  Family Communication: Admission, patients condition and plan of care  including tests being ordered have been discussed with the patient who indicate understanding and agree with the plan and Code Status.  Code Status DNR  Likely DC to  TBD  Condition Critical  Consults called: PCCM  Admission status: inpatient  Time spent in minutes : critical care 45 minutes   Pearson Grippe M.D on 11/27/2016 at 9:27 PM  Between 7am to 7pm - Pager - [phone]   After 7pm go to [email or web address] - password [email or web address]  [facility] - Office  [phone]

## 2016-11-15 NOTE — ED Notes (Signed)
Critical care in room.  

## 2016-11-15 NOTE — ED Provider Notes (Signed)
[facility]-EMERGENCY DEPT Provider Note   CSN: [id number] Arrival date & time: 11/04/2016  1354     History   Chief Complaint Chief Complaint  Patient presents with  . Code Sepsis    HPI Connor Lewis is a 76 y.o. male.  Pt presents to the ED today with altered mental status.  The pt comes in from home.  Family called EMS.  Pt is a palliative care patient and is a DNR  Per EMS, he was recently put on Abx.  I looked in chart and his doctor called in zithromax yesterday for bronchitis.  Pt is unable to give any history.       Past Medical History:  Diagnosis Date  . Anxiety and depression   . Chronic bronchiolitis (HCC)   . 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 Diverticulitis   . Urinary retention   . Urine incontinence     Patient Active Problem List   Diagnosis Date Noted  . Encephalopathy 10/12/2016  . Pressure injury of skin 10/12/2016  . Oral candidiasis   . Urge incontinence of urine 05/07/2016  . Incontinence of feces with fecal urgency 05/07/2016  . Anxiety 03/25/2016  . Insomnia 03/25/2016  . Hypertension 03/25/2016  . Palliative care patient 01/11/2016  . Chronic right-sided back pain 01/11/2016  . 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 Recurrent URI (upper respiratory infection) 09/14/2015  . Subclinical hypothyroidism 09/12/2015  . Patient underweight 09/12/2015  . Acute urinary retention 08/18/2015  . 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 Urinary retention 08/10/2015  . Chronic respiratory failure with hypoxia, on home oxygen therapy (HCC) 08/04/2015  . Hypoxia, sleep related 07/06/2015  . Allergic rhinitis 05/11/2015  . Prediabetes 05/11/2015  . Chronic bronchitis (HCC) 04/07/2015  . Peripheral neuropathy 04/07/2015    Past Surgical History:  Procedure Laterality Date  . none         Home Medications    Prior to Admission medications   Medication Sig Start Date End Date Taking? Authorizing  Provider  acetaminophen (TYLENOL) 325 MG tablet Take 325 mg by mouth every 6 (six) hours as needed for mild pain or moderate pain.    [provider]  albuterol (PROVENTIL HFA;VENTOLIN HFA) 108 (90 Base) MCG/ACT inhaler Inhale 2 puffs into the lungs every 6 (six) hours as needed for wheezing or shortness of breath. 07/06/15   Krebs, Laurel Dimmer, NP  atorvastatin (LIPITOR) 40 MG tablet Take 1 tablet (40 mg total) by mouth daily. 02/29/16   Karamalegos, Netta Neat, DO  azithromycin (ZITHROMAX Z-PAK) 250 MG tablet Take 2 tabs (  total) on Day 1. Take 1 tab ( ) daily for next 4 days. 11/14/16   Karamalegos, Netta Neat, DO  baclofen (LIORESAL) 10 MG tablet Take 0.5-1 tablets (5-10 mg total) by mouth 3 (three) times daily as needed for muscle spasms. 05/07/16   Karamalegos, Netta Neat, DO  carbamide peroxide (EAR WAX REMOVAL DROPS) 6.5 % otic solution Place 5 drops into both ears 2 (two) times daily. Patient taking differently: Place 5 drops into both ears 2 (two) times daily as needed (ear wax).  05/11/15   Loura Pardon, NP  finasteride (PROSCAR) 5 MG tablet TAKE ONE (1) TABLET EACH DAY 08/13/16   Vanna Scotland, MD  fluticasone [facility]) 50 MCG/ACT nasal spray Place 2 sprays into both nostrils daily. Patient taking differently: Place 2 sprays into both nostrils daily as needed for allergies or rhinitis.  03/25/16  Karamalegos, Netta Neat, DO  Fluticasone-Umeclidin-Vilant (TRELEGY ELLIPTA) 100-62.5-25 MCG/INH AEPB Inhale 1 puff into the lungs daily. 05/20/16   Merwyn Katos, MD  gabapentin (NEURONTIN) 100 MG capsule TAKE ONE (1) CAPSULE THREE (3) TIMES EACH DAY AS NEEDED FOR NERVE PAIN 08/06/16   Smitty Cords, DO  HYDROcodone-acetaminophen (NORCO/VICODIN) 5-325 MG tablet Take 0.5 tablets by mouth every 6 (six) hours as needed for moderate pain.    [provider]  ipratropium (ATROVENT) 0.06 % nasal spray Place 2 sprays into both nostrils 4 (four) times daily. For up  to 5-7 days then stop. 09/13/16   Karamalegos, Netta Neat, DO  Ipratropium-Albuterol (COMBIVENT) 20-100 MCG/ACT AERS respimat Inhale 1 puff into the lungs every 6 (six) hours as needed for wheezing or shortness of breath. 03/25/16   Karamalegos, Netta Neat, DO  lisinopril (PRINIVIL,ZESTRIL) 5 MG tablet Take 1 tablet (5 mg total) by mouth daily. 03/25/16   Karamalegos, Netta Neat, DO  LORazepam (ATIVAN) 1 MG tablet Take 0.5-1 tablets (0.5-1 mg total) by mouth every 8 (eight) hours as needed for anxiety or sleep. Patient taking differently: Take 0.5 mg by mouth every 8 (eight) hours as needed for anxiety or sleep.  09/13/16   Karamalegos, Netta Neat, DO  Meth-Hyo-M Bl-Na Phos-Ph Sal (URIBEL) 118 MG CAPS Take 1 capsule (118 mg total) by mouth QID. Patient not taking: Reported on 10/11/2016 08/30/15   Michiel Cowboy A, PA-C  Nutritional Supplements Sheila Oats) MISC Take 1 each by mouth daily. 08/10/15   Loura Pardon, NP  OXYGEN Inhale 3-5 L into the lungs continuous.     [provider]  predniSONE (DELTASONE) 20 MG tablet Take 1 tablet (20 mg total) by mouth daily. 10/16/16   Merwyn Katos, MD  SILTUSSIN SA 100 MG/5ML syrup TAKE ONE TO TWO TEASPOONFULS EVERY FOUR HOURS AS NEEDED FOR COUGH EXPECTORANT 09/09/16   Karamalegos, Netta Neat, DO  tamsulosin (FLOMAX) 0.4 MG CAPS capsule Take 2 capsules (0.8 mg total) by mouth at bedtime. 03/25/16   Smitty Cords, DO    Family History Family History  Problem Relation Age of Onset  . Stroke Mother   . Diabetes Mother   . Heart disease Father   . Kidney disease Unknown        mothers side  . Prostate cancer Neg Hx   . Kidney cancer Neg Hx   . Bladder Cancer Neg Hx     Social History Social History  Substance Use Topics  . Smoking status: Former Smoker    Packs/day: 1.00    Years: 56.00  . Smokeless tobacco: Former Neurosurgeon     Comment: quit in 2015  . Alcohol use 0.0 oz/week     Comment: occ     Allergies   Doxycycline  and Sulfa antibiotics   Review of Systems Review of Systems  Unable to perform ROS: Mental status change     Physical Exam Updated Vital Signs BP 95/74   Pulse 65   Temp (!) 104.2 F (40.1 C) (Rectal)   Resp (!) 26   Ht  (1.753 m)   Wt 51.3 kg (113 lb)   SpO2 99%   BMI 16.69 kg/m   Physical Exam  Constitutional: He appears well-developed. He appears cachectic. He appears distressed.  HENT:  Head: Normocephalic and atraumatic.  Right Ear: External ear normal.  Left Ear: External ear normal.  Nose: Nose normal.  Mouth/Throat: Mucous membranes are dry.  Eyes: Pupils are equal, round, and reactive  to light. Conjunctivae and EOM are normal.  Neck: Normal range of motion. Neck supple.  Cardiovascular: Regular rhythm, normal heart sounds and intact distal pulses.  Tachycardia present.   Pulmonary/Chest: Accessory muscle usage present. Tachypnea noted. He is in respiratory distress. He has wheezes.  Abdominal: Soft. Bowel sounds are normal.  Musculoskeletal: Normal range of motion.  Neurological:  Pt will open eyes to verbal.  He is moving all 4 extremities.  Skin: Skin is warm.  Nursing note and vitals reviewed.    ED Treatments / Results  Labs (all labs ordered are listed, but only abnormal results are displayed) Labs Reviewed  COMPREHENSIVE METABOLIC PANEL - Abnormal; Notable for the following:       Result Value   Sodium 132 (*)    Potassium 5.7 (*)    Chloride 91 (*)    Glucose, Bld 128 (*)    BUN 43 (*)    Creatinine, Ser 1.89 (*)    Total Protein 6.3 (*)    Albumin 2.3 (*)    ALT 10 (*)    Total Bilirubin 1.8 (*)    GFR calc non Af Amer 33 (*)    GFR calc Af Amer 39 (*)    Anion gap 16 (*)    All other components within normal limits  CBC WITH DIFFERENTIAL/PLATELET - Abnormal; Notable for the following:    WBC 23.8 (*)    Neutro Abs 22.8 (*)    Lymphs Abs 0.0 (*)    All other components within normal limits  URINALYSIS, ROUTINE W REFLEX  MICROSCOPIC - Abnormal; Notable for the following:    APPearance HAZY (*)    Hgb urine dipstick MODERATE (*)    Protein, ur 100 (*)    Leukocytes, UA SMALL (*)    Bacteria, UA FEW (*)    Squamous Epithelial / LPF 0-5 (*)    All other components within normal limits  TROPONIN I - Abnormal; Notable for the following:    Troponin I 0.18 (*)    All other components within normal limits  I-STAT CG4 LACTIC ACID, ED - Abnormal; Notable for the following:    Lactic Acid, Venous 4.77 (*)    All other components within normal limits  I-STAT ARTERIAL BLOOD GAS, ED - Abnormal; Notable for the following:    pH, Arterial 7.311 (*)    pCO2 arterial 54.1 (*)    pO2, Arterial 111.0 (*)    All other components within normal limits  CULTURE, BLOOD (ROUTINE X 2)  CULTURE, BLOOD (ROUTINE X 2)  I-STAT CG4 LACTIC ACID, ED    EKG  EKG Interpretation  Date/Time:  Friday 12-13-16 14:22:55 EDT Ventricular Rate:  134 PR Interval:    QRS Duration: 98 QT Interval:  272 QTC Calculation: 410 R Axis:   89 Text Interpretation:  Sinus tachycardia Consider right atrial enlargement Low voltage, precordial leads Consider right ventricular hypertrophy Nonspecific T abnormalities, lateral leads ST elevation, consider inferior injury No significant change since last tracing Confirmed by Jacalyn Lefevre [phone]) on Dec 13, 2016 3:41:48 PM       Radiology Dg Chest Port 1 View  Result Date: 13-Dec-2016 CLINICAL DATA:  Short of breath.  COPD.  Code sepsis. EXAM: PORTABLE CHEST 1 VIEW COMPARISON:  10/11/2016 FINDINGS: Extensive airspace disease in the right lower lobe compatible with pneumonia. Extreme lung base not included on the study. Left lung is clear COPD with hyperinflation.  Apical scarring on the right IMPRESSION: COPD with extensive right lower lobe  pneumonia Electronically Signed   By: Marlan Palau M.D.   On: 11-26-2016 14:22    Procedures Procedures (including critical care time)  Medications  Ordered in ED Medications  sodium chloride 0.9 % bolus 1,000 mL (0 mLs Intravenous Stopped 11-26-16 1445)    And  sodium chloride 0.9 % bolus 500 mL (0 mLs Intravenous Stopped November 26, 2016 1439)    And  sodium chloride 0.9 % bolus 250 mL (0 mLs Intravenous Stopped 2016/11/26 1432)  acetaminophen (TYLENOL) suppository 650 mg (650 mg Rectal Given November 26, 2016 1434)  cefTRIAXone (ROCEPHIN) 1 g in dextrose 5 % 50 mL IVPB (0 g Intravenous Stopped Nov 26, 2016 1515)  azithromycin (ZITHROMAX) 500 mg in dextrose 5 % 250 mL IVPB (0 mg Intravenous Stopped November 26, 2016 1545)  sodium chloride 0.9 % bolus 1,000 mL (1,000 mLs Intravenous New Bag/Given 11-26-16 1611)     Initial Impression / Assessment and Plan / ED Course  I have reviewed the triage vital signs and the nursing notes.  Pertinent labs & imaging results that were available during my care of the patient were reviewed by me and considered in my medical decision making (see chart for details).    This patient meets SIRS Criteria and may be septic. SIRS = Systemic Inflammatory Response Syndrome  Best Practice Recommends:   Notify the nurse immediately to increase monitoring of patient.   The recent clinical data is shown below. Vitals:   26-Nov-2016 1550 11/26/2016 1600 2016/11/26 1605 11/26/2016 1610  BP: 93/78 (!)  Pulse: (!) 116 (!) 108 (!) 110 65  Resp: (!) 24 (!) 24 (!) 21 (!) 26  Temp:      TempSrc:      SpO2: 97% 97% 97% 99%  Weight:      Height:       BP is still low, but HR is improving with IVFs.  Pt's sensorium is improving with bipap, decreasing fever, and IVFs.  CAP abx given.  Sepsis fluids given.  CRITICAL CARE Performed by: Jacalyn Lefevre   Total critical care time: 60 minutes  Critical care time was exclusive of separately billable procedures and treating other patients.  Critical care was necessary to treat or prevent imminent or life-threatening deterioration.  Critical care was time spent personally by me on the  following activities: development of treatment plan with patient and/or surrogate as well as nursing, discussions with consultants, evaluation of patient's response to treatment, examination of patient, obtaining history from patient or surrogate, ordering and performing treatments and interventions, ordering and review of laboratory studies, ordering and review of radiographic studies, pulse oximetry and re-evaluation of patient's condition.   Pt d/w ICU Dr. Vassie Loll for admission. Final Clinical Impressions(s) / ED Diagnoses   Final diagnoses:  Fever, unspecified fever cause  Sepsis, due to unspecified organism (HCC)  Acute on chronic respiratory failure with hypoxia and hypercapnia (HCC)  AKI (acute kidney injury) (HCC)  Community acquired pneumonia of right lower lobe of lung (HCC)  Septic shock (HCC)    New Prescriptions New Prescriptions   No medications on file     Jacalyn Lefevre, MD 11-26-16 1710

## 2016-11-15 NOTE — Progress Notes (Signed)
Placed patient back on BiPAP on previous settings due to increase in WOB. RN aware.

## 2016-11-16 ENCOUNTER — Inpatient Hospital Stay (HOSPITAL_COMMUNITY): Payer: Medicare Other

## 2016-11-16 DIAGNOSIS — J9622 Acute and chronic respiratory failure with hypercapnia: Secondary | ICD-10-CM

## 2016-11-16 DIAGNOSIS — J9621 Acute and chronic respiratory failure with hypoxia: Secondary | ICD-10-CM

## 2016-11-16 LAB — SODIUM, URINE, RANDOM: Sodium, Ur: 29 mmol/L

## 2016-11-16 MED ORDER — SCOPOLAMINE 1 MG/3DAYS TD PT72
1.0000 | MEDICATED_PATCH | TRANSDERMAL | Status: DC
  Administered 2016-11-16: 1.5 mg via TRANSDERMAL
  Filled 2016-11-16: qty 1

## 2016-11-16 MED ORDER — MIDAZOLAM HCL 2 MG/2ML IJ SOLN
0.5000 mg | Freq: Once | INTRAMUSCULAR | Status: AC
  Administered 2016-11-16: 0.5 mg via INTRAVENOUS
  Filled 2016-11-16: qty 2

## 2016-11-16 MED ORDER — LORAZEPAM 2 MG/ML IJ SOLN: 0.5000 mg | INTRAMUSCULAR | Status: DC | PRN

## 2016-11-16 MED ORDER — MORPHINE SULFATE (PF) 2 MG/ML IV SOLN
2.0000 mg | INTRAVENOUS | Status: DC | PRN
  Administered 2016-11-16: 2 mg via INTRAVENOUS
  Filled 2016-11-16: qty 1

## 2016-11-16 MED ORDER — FUROSEMIDE 10 MG/ML IJ SOLN
40.0000 mg | Freq: Once | INTRAMUSCULAR | Status: AC
  Administered 2016-11-16: 40 mg via INTRAVENOUS
  Filled 2016-11-16: qty 4

## 2016-11-16 MED ORDER — FUROSEMIDE 10 MG/ML IJ SOLN
20.0000 mg | Freq: Once | INTRAMUSCULAR | Status: AC
  Administered 2016-11-16: 20 mg via INTRAVENOUS
  Filled 2016-11-16: qty 2

## 2016-11-18 LAB — URINE CULTURE: Culture: 40000 — AB

## 2016-11-20 LAB — CULTURE, BLOOD (ROUTINE X 2)
CULTURE: NO GROWTH
Culture: NO GROWTH
SPECIAL REQUESTS: ADEQUATE
SPECIAL REQUESTS: ADEQUATE

## 2016-11-26 ENCOUNTER — Ambulatory Visit: Payer: Medicare Other | Admitting: Urology

## 2016-12-02 NOTE — Progress Notes (Signed)
Called by RR at 0021 regarding patient status. Patient with rapid decline in mental and respiratory status according to [facility] RN and oncall RR. Patient not tolerating BiPAP machine and becoming increasing anxious. RR noted increased rhonchi, tachypnea and oxygen desaturation (pulse ox reading 80-88%). Advised to give IV Lasix and Versed.   Called to bedside at 0100. RR unable to bring up patients oxygen levels. Patient continuing to decline. On assessment, patient unresponsive to verbal commands, LS - rhnoch throughout/diminished at the bases. Patient still tachypneic with respiratory rate in the 30-40's.HR 110-120's. BP stable. 1+ generalized edema noted. Patient unable to tolerate Bipap so placed on 6L [place]. ABGs ordered and CCM called for consult. Due to patients poor physical condition and likely poor health outcomes, next of kin (daughter) was notified to discuss palliative care. Daughter agrees to comfort measures only at this time. CCM agrees.     Assessment/plam: COPD/respiratory distress: Continue with /Ventimask for oxygen support. Ativan PRN ordered for anxiety.    Pain management: Start patient on PRN morphine for pain management.   Audrea Muscat, APRN  [facility]  7p-7a [phone]

## 2016-12-02 NOTE — Progress Notes (Signed)
PROGRESS NOTE    Connor Lewis  GNF:[id number] DOB: June 03, 1940 DOA: December 12, 2016 PCP: Smitty Cords, DO   Brief Narrative: 76 yo male with severe copd on hospice at home admitted with sepsis,respiratory failure.events noted.patient now comfort care/hospice.patient not arousable this morning.   Assessment & Plan:   Principal Problem:   Acute on chronic respiratory failure with hypoxia and hypercapnia (HCC) Active Problems:   Sepsis (HCC)   Hyperkalemia   Fever   ARF (acute renal failure) (HCC)   UTI (urinary tract infection)   Acute respiratory failure (HCC)   AKI (acute kidney injury) (HCC)   Community acquired pneumonia of right lower lobe of lung (HCC)  Hypercarbic respiratory failure due to copd/pneumonia..comfort care.dnr.    DVT prophylaxis: none Code Status: dnr Family Communication: none Disposition Plantbd   Consultantnone  Procedures: none  Antimicrobials: rocepin/vanco   Subjective: Resting.did not respond to touch.  Objective: Vitals:   11/02/2016 0052 11/20/2016 0259 12/01/2016 0415 11/13/2016 0545  BP: 131/84  (!) 141/60   Pulse:      Resp: (!) 33  (!) 38   Temp:      TempSrc:      SpO2: (!) 88% 98% 96%   Weight:    52.2 kg (115 lb 1.3 oz)  Height:        Intake/Output Summary (Last 24 hours) at 11/13/2016 1018 Last data filed at 11/13/2016 0420  Gross per 24 hour  Intake          9229.58 ml  Output              500 ml  Net          8729.58 ml   Filed Weights   Dec 12, 2016 1400 12-Dec-2016 2013 11/13/2016 0545  Weight: 51.3 kg (113 lb) 52.2 kg (115 lb) 52.2 kg (115 lb 1.3 oz)    Examination:  General exam: Appears calm and comfortable  Respiratory system: Clear to auscultation. Respiratory effort normal. Cardiovascular system: S1 & S2 heard, RRR. No JVD, murmurs, rubs, gallops or clicks. No pedal edema. Gastrointestinal system: Abdomen is nondistended, soft and nontender. No organomegaly or masses felt. Normal bowel sounds heard. Central  nervous system: Alert and oriented. No focal neurological deficits. Extremities: Symmetric 5 x 5 power. Skin: No rashes, lesions or ulcers Psychiatry: Judgement and insight appear normal. Mood & affect appropriate.     Data Reviewed: I have personally reviewed following labs and imaging studies  CBC:  Recent Labs Lab 12-12-2016 1423  WBC 23.8*  NEUTROABS 22.8*  HGB 15.6  HCT 47.5  MCV 87.6  PLT 305   Basic Metabolic Panel:  Recent Labs Lab 2016-12-12 1423 Dec 12, 2016 2213  NA 132*  --   K 5.7*  --   CL 91*  --   CO2 25  --   GLUCOSE 128*  --   BUN 43*  --   CREATININE 1.89* 1.64*  CALCIUM 9.4  --    GFR: Estimated Creatinine Clearance: 29.2 mL/min (A) (by C-G formula based on SCr of 1.64 mg/dL (H)). Liver Function Tests:  Recent Labs Lab 12/12/2016 1423  AST 30  ALT 10*  ALKPHOS 81  BILITOT 1.8*  PROT 6.3*  ALBUMIN 2.3*   No results for input(s): LIPASE, AMYLASE in the last 168 hours. No results for input(s): AMMONIA in the last 168 hours. Coagulation Profile: No results for input(s): INR, PROTIME in the last 168 hours. Cardiac Enzymes:  Recent Labs Lab 12-Dec-2016 1423 12/12/2016 2213  TROPONINI 0.18* 0.07*  BNP (last 3 results) No results for input(s): PROBNP in the last 8760 hours. HbA1C: No results for input(s): HGBA1C in the last 72 hours. CBG: No results for input(s): GLUCAP in the last 168 hours. Lipid Profile: No results for input(s): CHOL, HDL, LDLCALC, TRIG, CHOLHDL, LDLDIRECT in the last 72 hours. Thyroid Function Tests:  Recent Labs  11/10/2016 2213  TSH 1.061   Anemia Panel: No results for input(s): VITAMINB12, FOLATE, FERRITIN, TIBC, IRON, RETICCTPCT in the last 72 hours. Sepsis Labs:  Recent Labs Lab 11/13/2016 1433 11/05/2016 1743  LATICACIDVEN 4.77* 2.37*    Recent Results (from the past 240 hour(s))  Blood Culture (routine x 2)     Status: None (Preliminary result)   Collection Time: 11/04/2016  2:11 PM  Result Value Ref Range  Status   Specimen Description BLOOD RIGHT FOREARM  Final   Special Requests   Final    BOTTLES DRAWN AEROBIC AND ANAEROBIC Blood Culture adequate volume   Culture NO GROWTH < 24 HOURS  Final   Report Status PENDING  Incomplete  Blood Culture (routine x 2)     Status: None (Preliminary result)   Collection Time: 11/10/2016  2:20 PM  Result Value Ref Range Status   Specimen Description BLOOD RIGHT ANTECUBITAL  Final   Special Requests   Final    BOTTLES DRAWN AEROBIC ONLY Blood Culture adequate volume   Culture NO GROWTH < 24 HOURS  Final   Report Status PENDING  Incomplete         Radiology Studies: US Renal  Result Date: 2016-12-10 CLINICAL DATA:  Acute renal failure EXAM: RENAL ULTRASOUND COMPARISON:  None. FINDINGS: Right Kidney: Length: 9.3 cm. Echogenicity within normal limits. There is renal cortical thinning. 8 question No mass, perinephric fluid, or hydronephrosis visualized. No sonographically demonstrable calculus or ureterectasis. Left Kidney: Length: 11.2 cm. Echogenicity and renal cortical thickness are within normal limits. No mass, perinephric fluid, or hydronephrosis visualized. No sonographically demonstrable calculus or ureterectasis. Bladder: Decompressed with Foley catheter and cannot be assessed. IMPRESSION: There is renal cortical thinning on the right. No obstructing focus in either kidney. Renal echogenicity within normal limits. Urinary bladder decompressed with Foley catheter. The right kidney is somewhat smaller than the left. Given this finding as well as the renal cortical thinning, a question of renal artery stenosis on the right must be suggested. In this regard, question whether patient is hypertensive. Electronically Signed   By: Bretta Bang III M.D.   On: 12/10/16 02:02   Dg Chest Port 1 View  Result Date: 11/09/2016 CLINICAL DATA:  Short of breath.  COPD.  Code sepsis. EXAM: PORTABLE CHEST 1 VIEW COMPARISON:  10/11/2016 FINDINGS: Extensive airspace  disease in the right lower lobe compatible with pneumonia. Extreme lung base not included on the study. Left lung is clear COPD with hyperinflation.  Apical scarring on the right IMPRESSION: COPD with extensive right lower lobe pneumonia Electronically Signed   By: Marlan Palau M.D.   On: 11/05/2016 14:22        Scheduled Meds: . atorvastatin  40 mg Oral Daily  . finasteride  5 mg Oral Daily  . gabapentin  100 mg Oral TID  . heparin  5,000 Units Subcutaneous Q8H  . ipratropium-albuterol  3 mL Nebulization Q6H  . methylPREDNISolone (SOLU-MEDROL) injection  80 mg Intravenous Q8H  . scopolamine  1 patch Transdermal Q72H  . sodium polystyrene  30 g Oral Once  . tamsulosin  0.8 mg Oral QHS   Continuous  Infusions: . sodium chloride 10 mL/hr at 11/14/2016 0030  . azithromycin    . cefTRIAXone (ROCEPHIN)  IV    . vancomycin       LOS: 1 day     Alwyn Ren, MD [facility]  If 7PM-7AM, please contact night-coverage [email or web address] Password [email or web address] 11/14/2016, 10:18 AM

## 2016-12-02 NOTE — Death Summary Note (Signed)
DEATH SUMMARY   Patient Details  Name: Connor Lewis MRN: [id number] DOB: Dec 17, 1940  Admission/Discharge Information   Admit Date:  November 21, 2016  Date of Death: Date of Death: November 22, 2016  Time of Death: Time of Death: 0530  Length of Stay: 1  Referring Physician: Smitty Cords, DO   Reason(s) for Hospitalization  Respiratory distress  Diagnoses  Preliminary cause of death:  Secondary Diagnoses (including complications and co-morbidities):  Principal Problem:   Acute on chronic respiratory failure with hypoxia and hypercapnia (HCC) Active Problems:   Sepsis (HCC)   Hyperkalemia   Fever   ARF (acute renal failure) (HCC)   UTI (urinary tract infection)   Acute respiratory failure (HCC)   AKI (acute kidney injury) (HCC)   Community acquired pneumonia of right lower lobe of lung [facility])   Brief Hospital Course (including significant findings, care, treatment, and services provided and events leading to death)  Connor Lewis is a 76 y.o. year old male who was admitted with end stage copd,hypoxic hypercapneic respiratory failure.did not improve with bipap.    Pertinent Labs and Studies  Significant Diagnostic Studies US Renal  Result Date: 2016/11/22 CLINICAL DATA:  Acute renal failure EXAM: RENAL ULTRASOUND COMPARISON:  None. FINDINGS: Right Kidney: Length: 9.3 cm. Echogenicity within normal limits. There is renal cortical thinning. 8 question No mass, perinephric fluid, or hydronephrosis visualized. No sonographically demonstrable calculus or ureterectasis. Left Kidney: Length: 11.2 cm. Echogenicity and renal cortical thickness are within normal limits. No mass, perinephric fluid, or hydronephrosis visualized. No sonographically demonstrable calculus or ureterectasis. Bladder: Decompressed with Foley catheter and cannot be assessed. IMPRESSION: There is renal cortical thinning on the right. No obstructing focus in either kidney. Renal echogenicity within normal limits. Urinary  bladder decompressed with Foley catheter. The right kidney is somewhat smaller than the left. Given this finding as well as the renal cortical thinning, a question of renal artery stenosis on the right must be suggested. In this regard, question whether patient is hypertensive. Electronically Signed   By: Bretta Bang III M.D.   On: 11-22-16 02:02   Dg Chest Port 1 View  Result Date: 11-21-16 CLINICAL DATA:  Short of breath.  COPD.  Code sepsis. EXAM: PORTABLE CHEST 1 VIEW COMPARISON:  10/11/2016 FINDINGS: Extensive airspace disease in the right lower lobe compatible with pneumonia. Extreme lung base not included on the study. Left lung is clear COPD with hyperinflation.  Apical scarring on the right IMPRESSION: COPD with extensive right lower lobe pneumonia Electronically Signed   By: Marlan Palau M.D.   On: 11-21-2016 14:22    Microbiology Recent Results (from the past 240 hour(s))  Blood Culture (routine x 2)     Status: None   Collection Time: 21-Nov-2016  2:11 PM  Result Value Ref Range Status   Specimen Description BLOOD RIGHT FOREARM  Final   Special Requests   Final    BOTTLES DRAWN AEROBIC AND ANAEROBIC Blood Culture adequate volume   Culture NO GROWTH 5 DAYS  Final   Report Status 11/20/2016 FINAL  Final  Blood Culture (routine x 2)     Status: None   Collection Time: 11/21/16  2:20 PM  Result Value Ref Range Status   Specimen Description BLOOD RIGHT ANTECUBITAL  Final   Special Requests   Final    BOTTLES DRAWN AEROBIC ONLY Blood Culture adequate volume   Culture NO GROWTH 5 DAYS  Final   Report Status 11/20/2016 FINAL  Final  Culture, Urine  Status: Abnormal   Collection Time: 12-06-2016  4:15 PM  Result Value Ref Range Status   Specimen Description URINE, RANDOM  Final   Special Requests NONE  Final   Culture 40,000 COLONIES/mL KLEBSIELLA PNEUMONIAE (A)  Final   Report Status 11/18/2016 FINAL  Final   Organism ID, Bacteria KLEBSIELLA PNEUMONIAE (A)  Final       Susceptibility   Klebsiella pneumoniae - MIC*    AMPICILLIN >=32 RESISTANT Resistant     CEFAZOLIN <=4 SENSITIVE Sensitive     CEFTRIAXONE <=1 SENSITIVE Sensitive     CIPROFLOXACIN <=0.25 SENSITIVE Sensitive     GENTAMICIN <=1 SENSITIVE Sensitive     IMIPENEM <=0.25 SENSITIVE Sensitive     NITROFURANTOIN 128 RESISTANT Resistant     TRIMETH/SULFA <=20 SENSITIVE Sensitive     AMPICILLIN/SULBACTAM 8 SENSITIVE Sensitive     PIP/TAZO <=4 SENSITIVE Sensitive     Extended ESBL NEGATIVE Sensitive     * 40,000 COLONIES/mL KLEBSIELLA PNEUMONIAE    Lab Basic Metabolic Panel:  Recent Labs Lab 2016/12/06 2213  CREATININE 1.64*   Liver Function Tests: No results for input(s): AST, ALT, ALKPHOS, BILITOT, PROT, ALBUMIN in the last 168 hours. No results for input(s): LIPASE, AMYLASE in the last 168 hours. No results for input(s): AMMONIA in the last 168 hours. CBC: No results for input(s): WBC, NEUTROABS, HGB, HCT, MCV, PLT in the last 168 hours. Cardiac Enzymes:  Recent Labs Lab Dec 06, 2016 2213  TROPONINI 0.07*   Sepsis Labs:  Recent Labs Lab 12/06/2016 1743  LATICACIDVEN 2.37*    Procedures/Operations  none   Alwyn Ren 11/22/2016, 4:53 PM

## 2016-12-02 NOTE — Progress Notes (Signed)
Received consult request in inbox this am from admitting team re: cardiology consult for elevated troponin. Pt's status has been changed to comfort care, little role for cardiology input here. D/w attending Dr. Jerolyn Center. Please re-consult if needed. Vickey Ewbank PA-C

## 2016-12-02 NOTE — Progress Notes (Signed)
Assumed care of this patient at 2300. Walked into the room to find rapid response at bedside trying to get patient's respiratory under control. Told by rapid that she adjusted the bipap settings and see how he does.   bipap started to alarm showing high respiratory rate of 55-60. Called rapid back to assess the patient. Patient taken off of bipap, placed on non-re breather. Decided by MDs that comfort care would be the best course given patient is non-verbal and has difficulty breathing due to COPD and PNA. Family notified of patient's status. Will continue to monitor

## 2016-12-02 NOTE — Progress Notes (Signed)
Patient is in moderate to severe distress. Altered, lethargic. Not tolerating being on or off BIPAP. Discussed with bedside nurse since DNR and previous hospice we will need to concentrate on his comfort care. Primary team is bedside and aware.

## 2016-12-02 NOTE — Progress Notes (Signed)
December 07, 2016 10:36 AM [facility] here to take Mr. Connor Lewis.  Daughter [name] notified. Kathryne Hitch

## 2016-12-02 NOTE — Progress Notes (Addendum)
While rounding on the unit, charge RN asked me to assess patient and assist with adjusting BIPAP mask.   Patient was lethargic, occasionally mumbles a few words, but is altered.  Patient is on BIPAP 45% 6/6.  SpO2 reading were hard to obtain because was very cool, clammy, and is shocky but after a several attempts, readings were obtained and sats were upper 70s - 80s, delayed capillary refill.  I spoke with the RT, settings changed to 60% 8/8 at 2333. Lung sounds were diminished, R > L.  Patient RR was in the upper 40-50s with increased work of the breathing. SBP maintained 110s-130s and HR was in the 120s.   About 15-20 minutes after BIPAP settings were changed, RR and WOB improved along with SpO2 (now 88-92%, which is okay for patient).    I walked away to answer a call and RN called me back to the room immediately because BIPAP was alarming, patient was breathing 50-55 breaths a minute and needed to be suctioned,  + gurgling heard from the doorway.  I removed the BIPAP, placed the patient on 6L [place] and suctioned white-thin secretions.  I reassessed his lung sounds + crackles/+ wet lungs.   I paged the [facility] NP at 0030  and received orders for Lasix  IV, and Versed 0.5mg  IV.  After administration of medications I placed the BIPAP back on the patient and patient was still breathing 50-55 breaths a minutes and work of breathing was still very labored.  I spoke with [facility] NP at 0045 and asked if a provider to come assess the patient at the bedside.    [facility] NP came to the bedside, ordered ABG, we discussed that patient, SpO2 was dropping, oxygen was increased and patient was placed on Venti Mask. I called [facility] per [facility] NP request at 0120 and asked if PCCM provider could come to the bedside as well and they did.  We all discussed that patient and [facility] NP called the patient's granddaughter to inform her of patient's status, per family will keep comfortable.  Patient was placed on NRB 15L. ABG was  cancelled.  Plan: - pain relief management as needed- - frequent oral suctioning and mouth care as needed-   Patient is a DNR  Start Time 2315 End Time 215

## 2016-12-02 DEATH — deceased

## 2016-12-20 ENCOUNTER — Ambulatory Visit: Payer: Medicare Other | Admitting: Family Medicine

## 2018-03-20 IMAGING — CR DG CHEST 2V
3 series · 3 of 3 positions shown · non-contrast
Comparison: None.

CLINICAL DATA: Shortness of breath

EXAM:
CHEST  2 VIEW

[chest lat (1 of 2)]
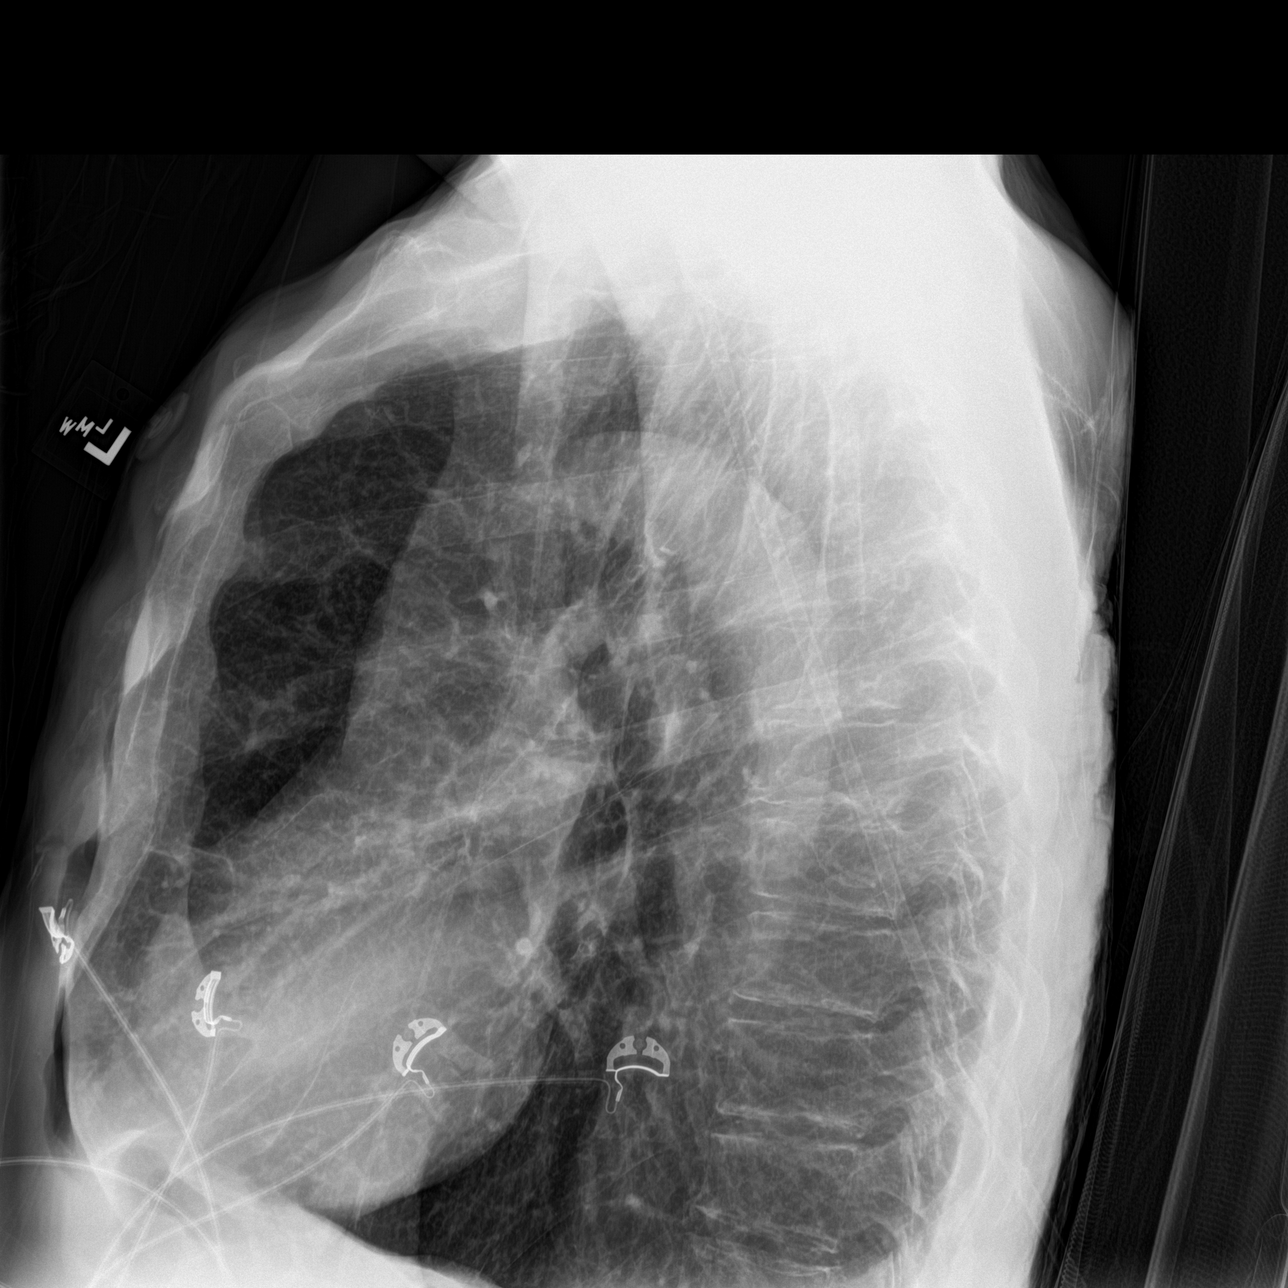

[chest ap]
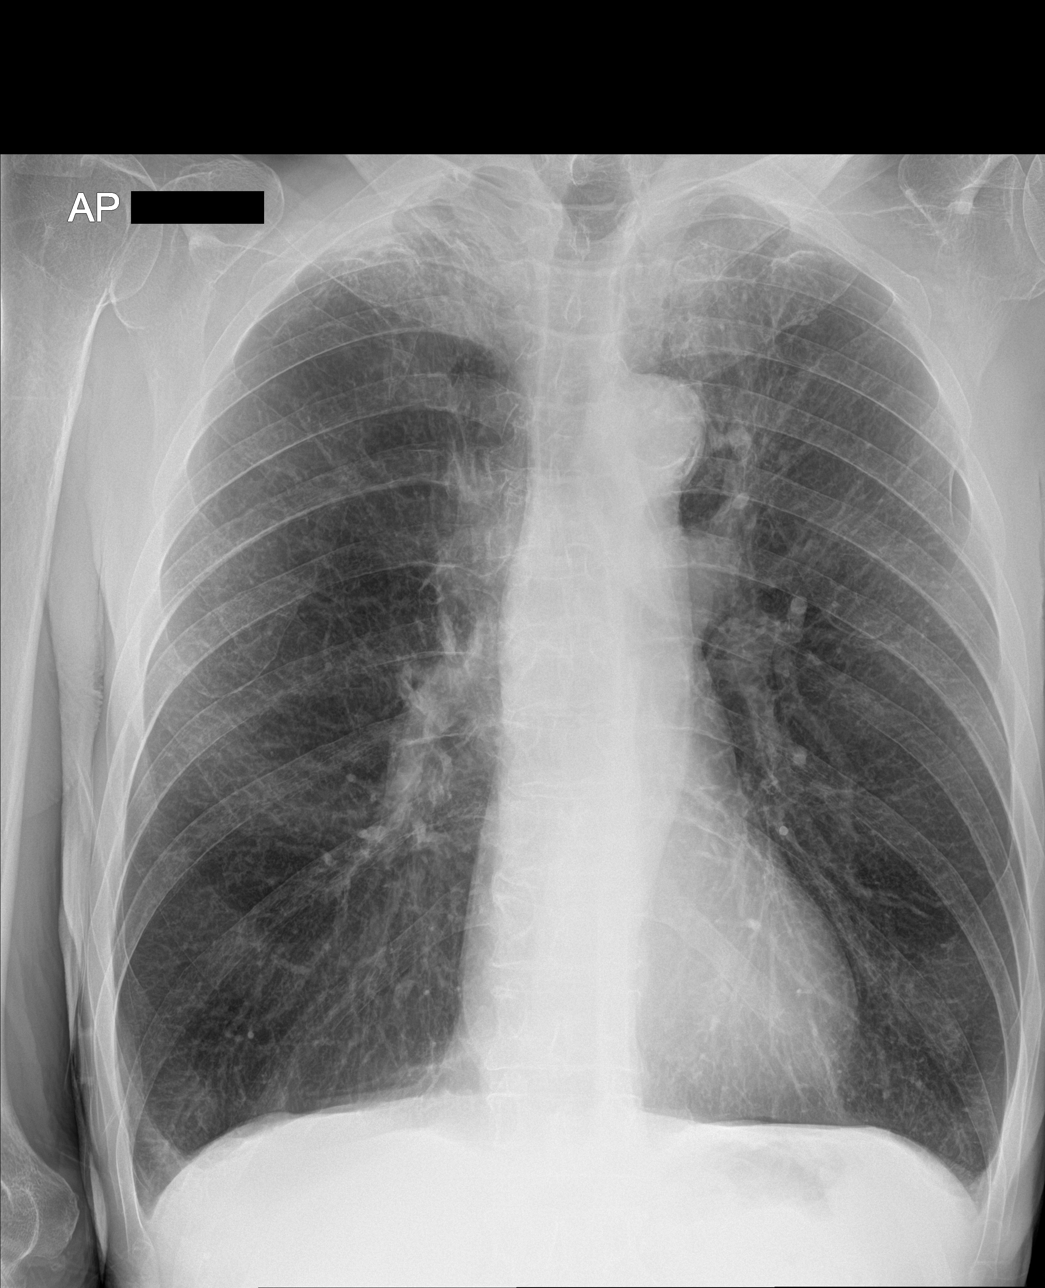

[chest lat (2 of 2)]
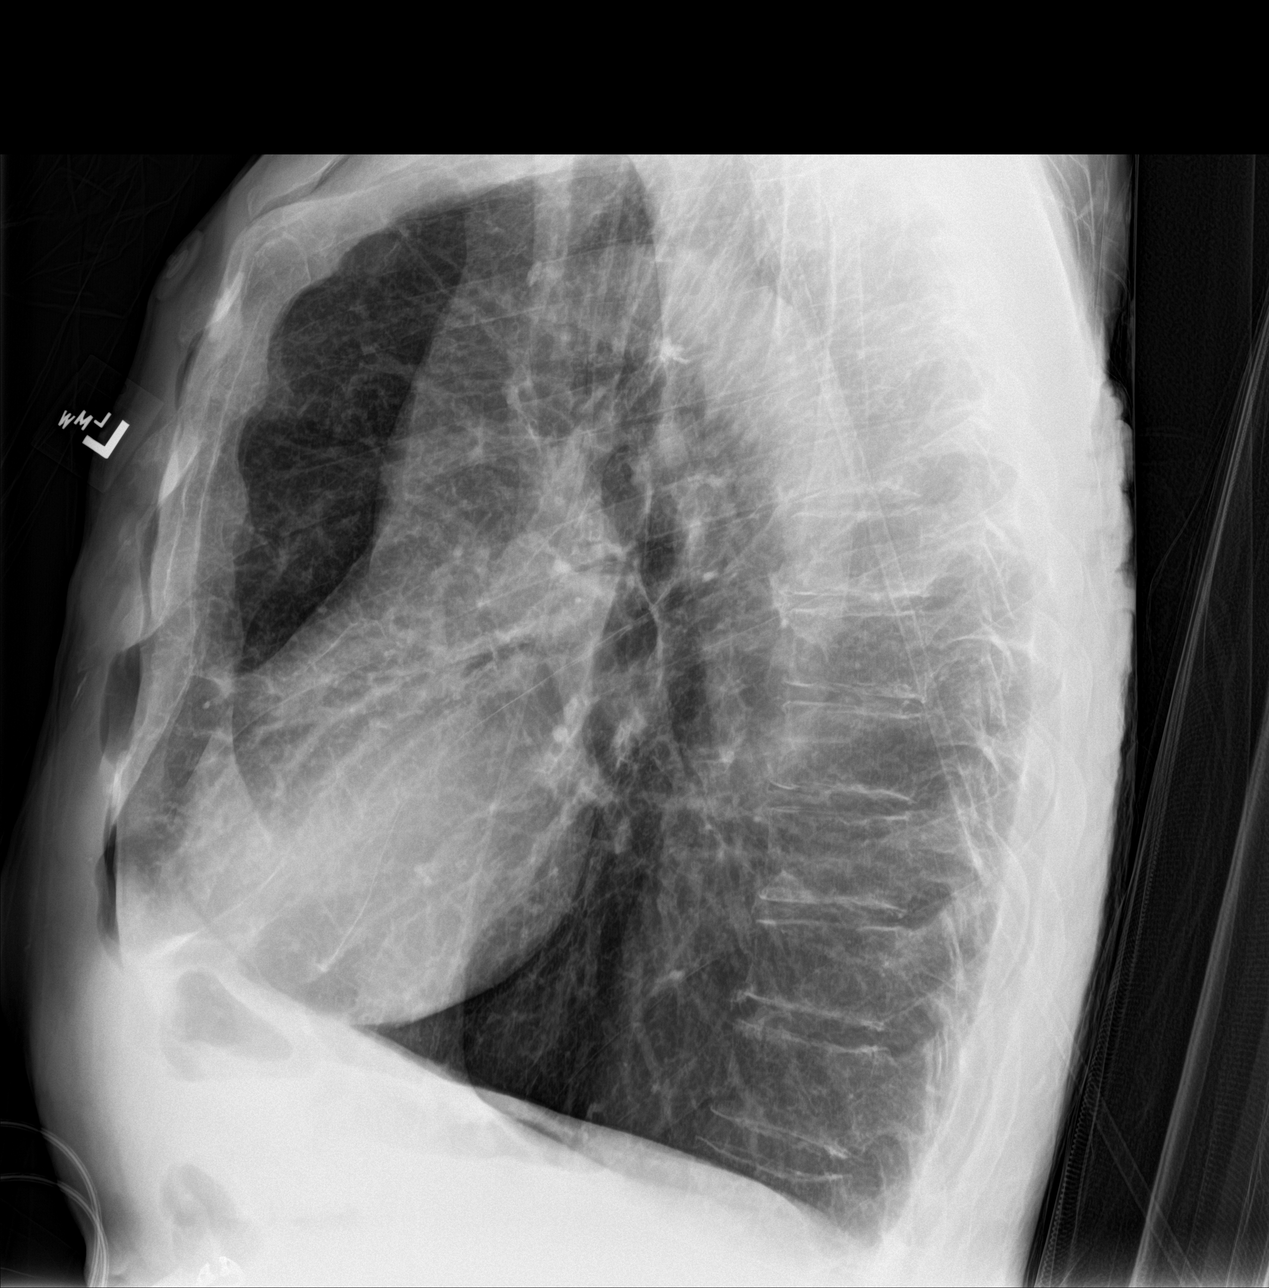

[3 of 3 positions shown; findings below may reference images not displayed]

FINDINGS: Hyperinflation of lungs with flattening of the diaphragm consistent
with COPD/emphysema. No nodules or masses. The heart, hila, and
mediastinum are normal. No other acute abnormalities.
IMPRESSION: COPD/emphysema.  No other abnormalities identified.

## 2019-07-03 IMAGING — DX DG CHEST 1V PORT
1 series · 2 of 2 positions shown · non-contrast
Comparison: 10/11/2016

CLINICAL DATA: Short of breath.  COPD.  Code sepsis.

EXAM:
PORTABLE CHEST 1 VIEW

[Series 1: chest · 0.14mm/px · 2 of 2 slices shown]
[im 1/2]
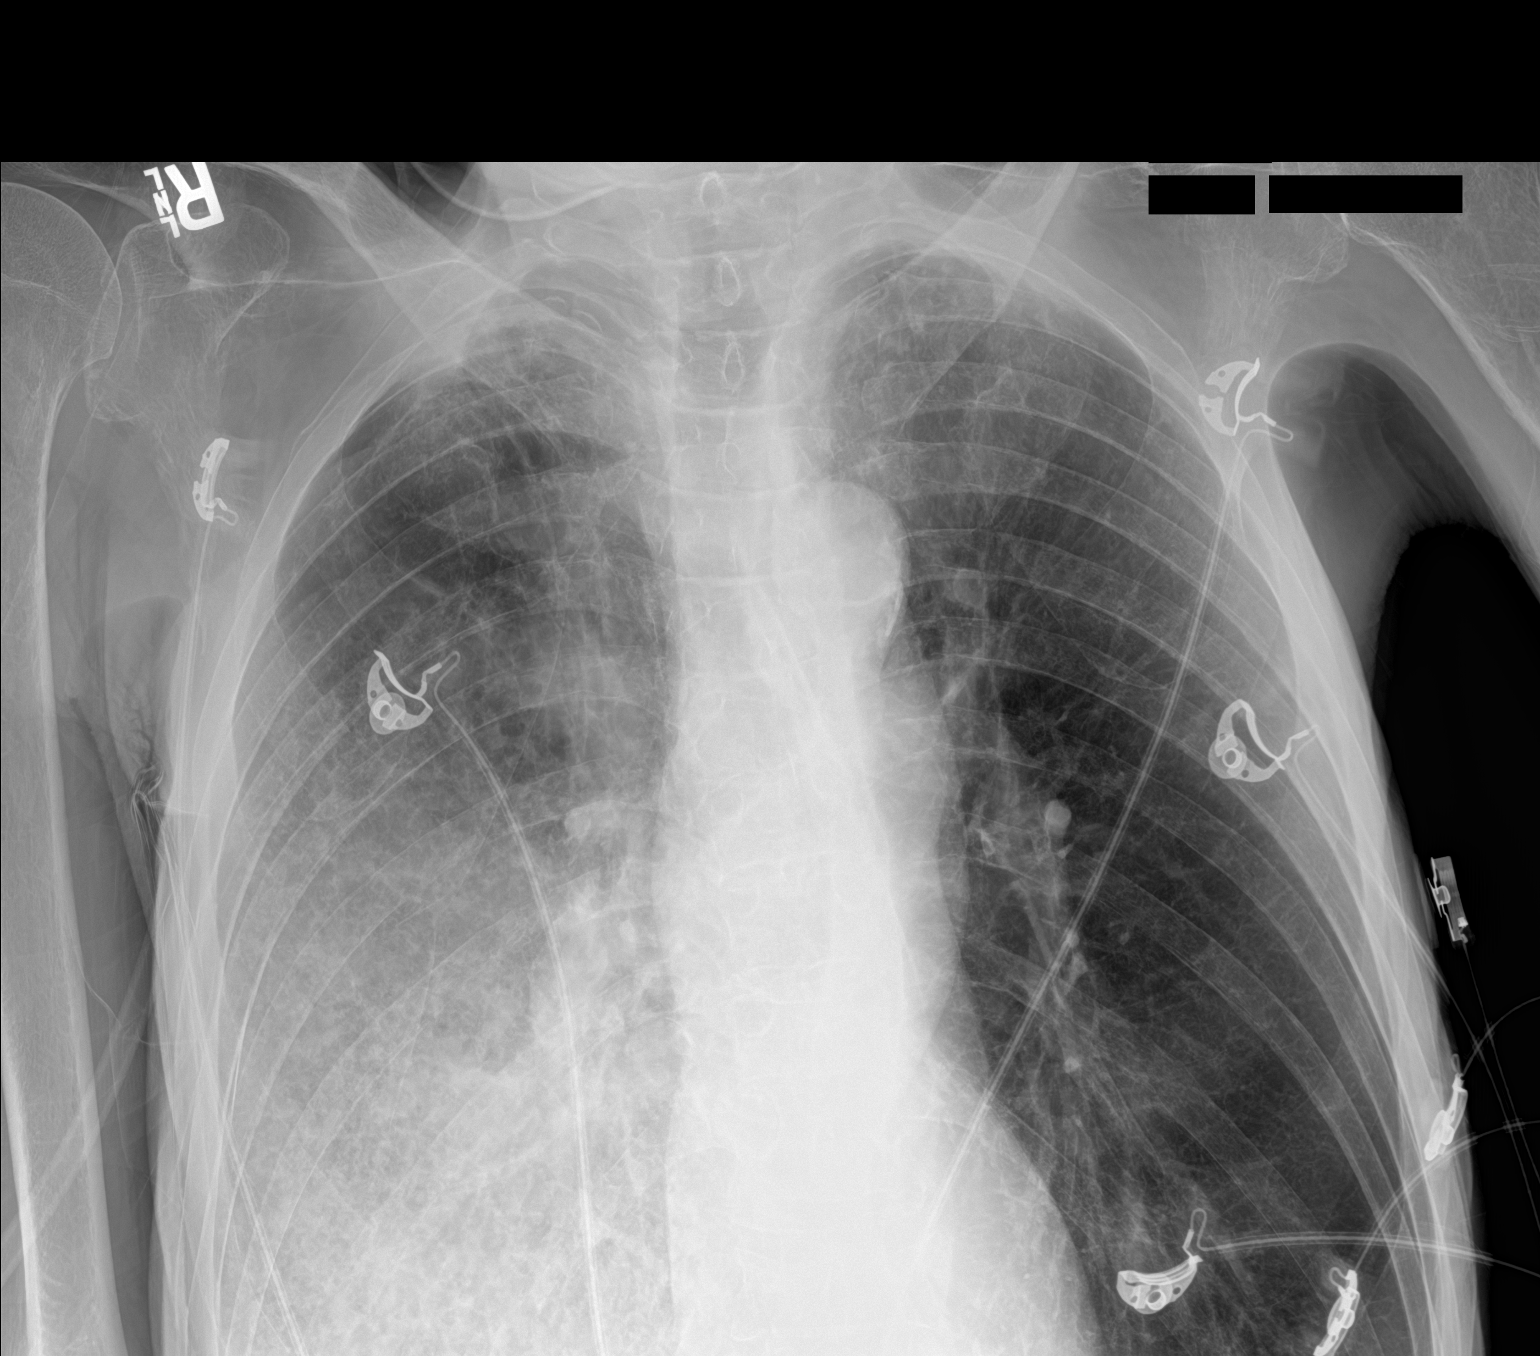
[im 2/2]
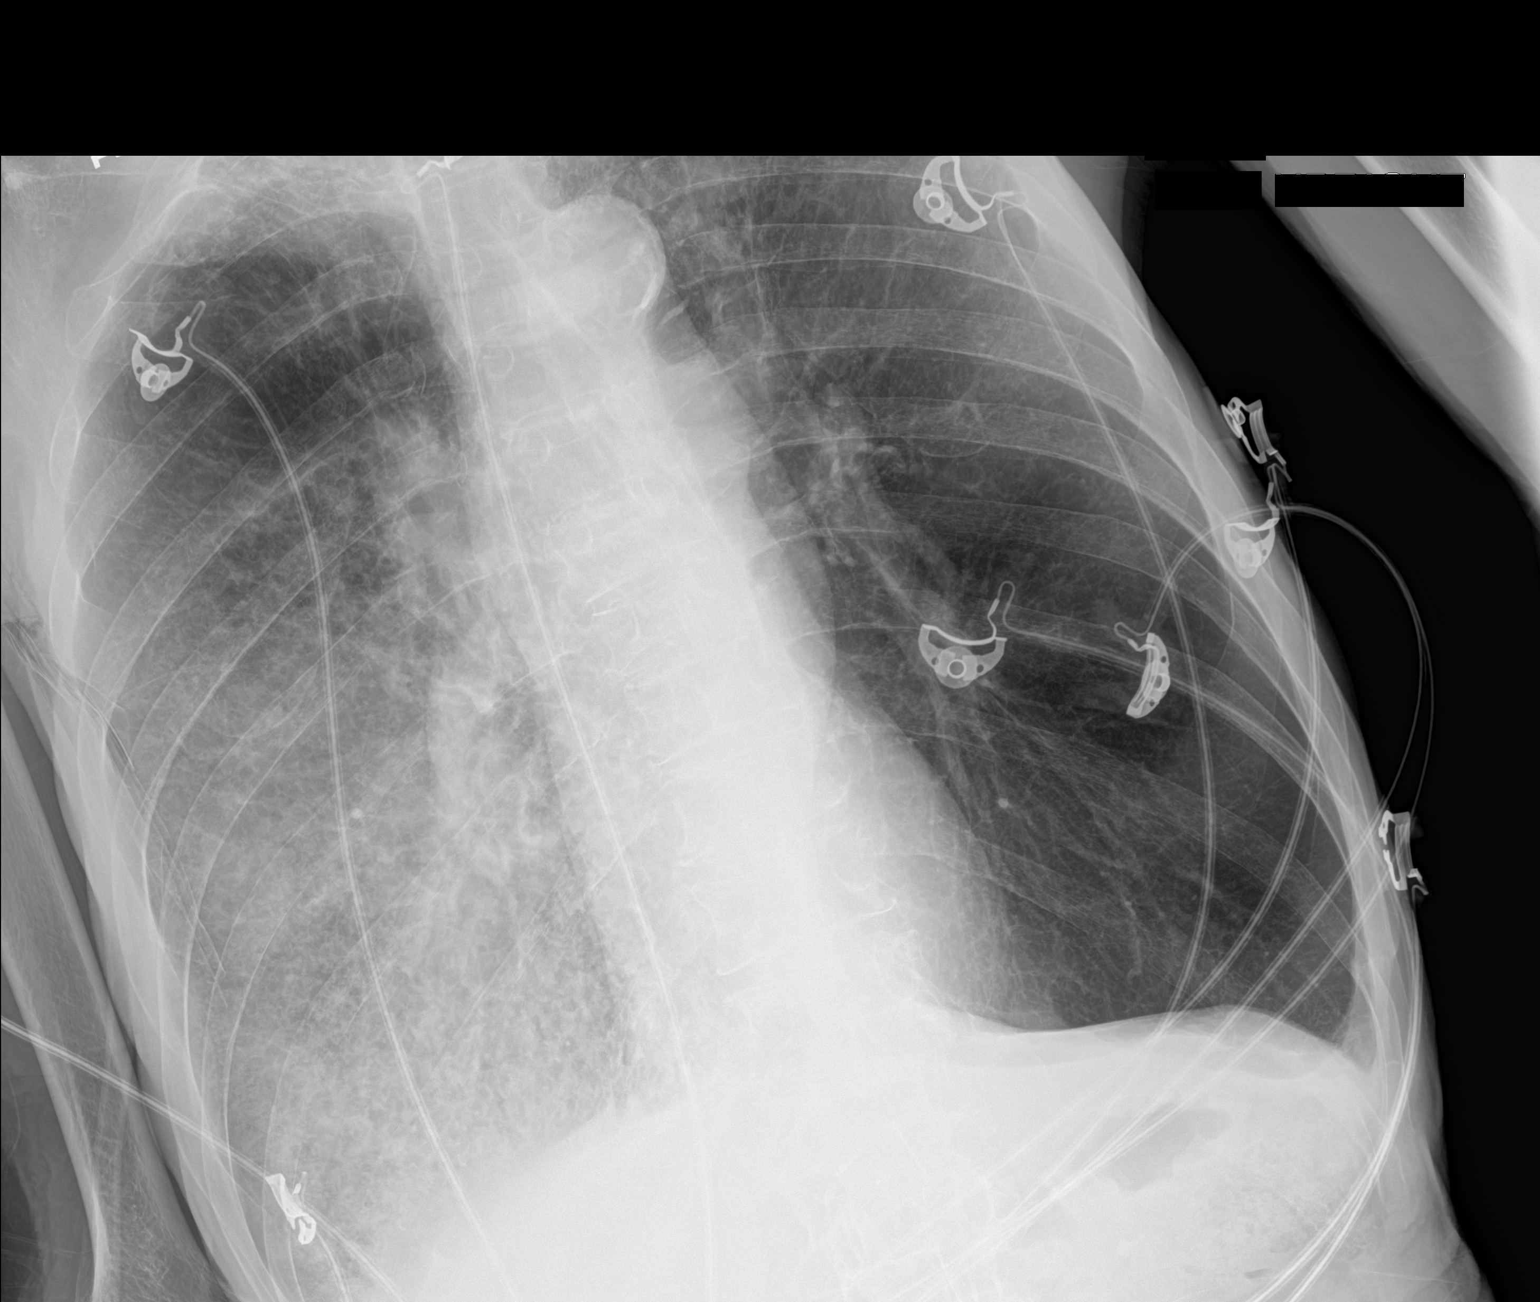

[2 of 2 positions shown; findings below may reference images not displayed]

FINDINGS: Extensive airspace disease in the right lower lobe compatible with
pneumonia. Extreme lung base not included on the study. Left lung is
clear

COPD with hyperinflation.  Apical scarring on the right
IMPRESSION: COPD with extensive right lower lobe pneumonia
